# Patient Record
Sex: Male | Born: 1960 | Hispanic: No | Marital: Married | Smoking: Former smoker
Health system: Southern US, Community
[De-identification: ages and names within clinical notes are randomized; demographics above are authoritative.]

## PROBLEM LIST (undated history)

## (undated) DIAGNOSIS — S42009A Fracture of unspecified part of unspecified clavicle, initial encounter for closed fracture: Secondary | ICD-10-CM

## (undated) DIAGNOSIS — M1990 Unspecified osteoarthritis, unspecified site: Secondary | ICD-10-CM

## (undated) DIAGNOSIS — R7303 Prediabetes: Secondary | ICD-10-CM

## (undated) DIAGNOSIS — N189 Chronic kidney disease, unspecified: Secondary | ICD-10-CM

## (undated) DIAGNOSIS — J302 Other seasonal allergic rhinitis: Secondary | ICD-10-CM

## (undated) DIAGNOSIS — Z8489 Family history of other specified conditions: Secondary | ICD-10-CM

## (undated) DIAGNOSIS — E669 Obesity, unspecified: Secondary | ICD-10-CM

## (undated) DIAGNOSIS — Z9889 Other specified postprocedural states: Secondary | ICD-10-CM

## (undated) DIAGNOSIS — J189 Pneumonia, unspecified organism: Secondary | ICD-10-CM

## (undated) DIAGNOSIS — Z87442 Personal history of urinary calculi: Secondary | ICD-10-CM

## (undated) HISTORY — DX: Obesity, unspecified: E66.9

## (undated) HISTORY — PX: NECK SURGERY: SHX720

## (undated) HISTORY — DX: Fracture of unspecified part of unspecified clavicle, initial encounter for closed fracture: S42.009A

## (undated) HISTORY — DX: Personal history of urinary calculi: Z87.442

---
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
## 1987-07-09 HISTORY — PX: KIDNEY SURGERY: SHX687

## 2001-07-08 HISTORY — PX: BICEPS TENDON REPAIR: SHX566

## 2001-07-08 HISTORY — PX: KNEE ARTHROSCOPY: SUR90

## 2005-03-25 ENCOUNTER — Emergency Department: Payer: Self-pay | Admitting: Internal Medicine

## 2011-09-09 ENCOUNTER — Emergency Department: Payer: Self-pay | Admitting: *Deleted

## 2011-09-09 LAB — CBC
HCT: 48.4 % (ref 40.0–52.0)
HGB: 16.2 g/dL (ref 13.0–18.0)
MCH: 31.2 pg (ref 26.0–34.0)
MCHC: 33.4 g/dL (ref 32.0–36.0)
MCV: 93 fL (ref 80–100)
Platelet: 304 10*3/uL (ref 150–440)
RBC: 5.18 10*6/uL (ref 4.40–5.90)
RDW: 13.1 % (ref 11.5–14.5)
WBC: 16.7 10*3/uL — ABNORMAL HIGH (ref 3.8–10.6)

## 2011-09-09 LAB — LIPASE, BLOOD: Lipase: 88 U/L (ref 73–393)

## 2011-09-09 LAB — COMPREHENSIVE METABOLIC PANEL
Albumin: 4.7 g/dL (ref 3.4–5.0)
Alkaline Phosphatase: 95 U/L (ref 50–136)
Anion Gap: 11 (ref 7–16)
BUN: 9 mg/dL (ref 7–18)
Bilirubin,Total: 0.5 mg/dL (ref 0.2–1.0)
Calcium, Total: 9.3 mg/dL (ref 8.5–10.1)
Chloride: 105 mmol/L (ref 98–107)
Co2: 26 mmol/L (ref 21–32)
Creatinine: 0.95 mg/dL (ref 0.60–1.30)
EGFR (African American): >60
EGFR (Non-African Amer.): >60
Glucose: 123 mg/dL — ABNORMAL HIGH (ref 65–99)
Osmolality: 283 (ref 275–301)
Potassium: 3.7 mmol/L (ref 3.5–5.1)
SGOT(AST): 21 U/L (ref 15–37)
SGPT (ALT): 31 U/L
Sodium: 142 mmol/L (ref 136–145)
Total Protein: 8.5 g/dL — ABNORMAL HIGH (ref 6.4–8.2)

## 2011-09-12 ENCOUNTER — Emergency Department: Payer: Self-pay | Admitting: Emergency Medicine

## 2011-09-12 LAB — COMPREHENSIVE METABOLIC PANEL
Alkaline Phosphatase: 72 U/L (ref 50–136)
Anion Gap: 11 (ref 7–16)
Bilirubin,Total: 0.2 mg/dL (ref 0.2–1.0)
Calcium, Total: 8.7 mg/dL (ref 8.5–10.1)
Chloride: 108 mmol/L — ABNORMAL HIGH (ref 98–107)
Co2: 23 mmol/L (ref 21–32)
Creatinine: 0.82 mg/dL (ref 0.60–1.30)
EGFR (Non-African Amer.): >60
Osmolality: 283 (ref 275–301)
Potassium: 3.9 mmol/L (ref 3.5–5.1)
Sodium: 142 mmol/L (ref 136–145)

## 2011-09-12 LAB — CBC WITH DIFFERENTIAL/PLATELET
Basophil #: 0 10*3/uL (ref 0.0–0.1)
Eosinophil %: 1.6 %
Monocyte %: 9.1 %
Neutrophil %: 59.6 %
Platelet: 304 10*3/uL (ref 150–440)
RBC: 4.81 10*6/uL (ref 4.40–5.90)

## 2011-09-16 ENCOUNTER — Other Ambulatory Visit: Payer: Self-pay | Admitting: Sports Medicine

## 2011-09-16 DIAGNOSIS — M542 Cervicalgia: Secondary | ICD-10-CM

## 2011-09-17 ENCOUNTER — Other Ambulatory Visit: Payer: Self-pay | Admitting: Sports Medicine

## 2011-09-17 ENCOUNTER — Ambulatory Visit
Admission: RE | Admit: 2011-09-17 | Discharge: 2011-09-17 | Disposition: A | Discharge status: Home / Self Care | Payer: BC Managed Care – PPO | Source: Ambulatory Visit | Attending: Sports Medicine | Admitting: Sports Medicine

## 2011-09-17 DIAGNOSIS — M542 Cervicalgia: Secondary | ICD-10-CM

## 2011-09-18 ENCOUNTER — Ambulatory Visit
Admission: RE | Admit: 2011-09-18 | Discharge: 2011-09-18 | Disposition: A | Discharge status: Home / Self Care | Payer: BC Managed Care – PPO | Source: Ambulatory Visit | Attending: Sports Medicine | Admitting: Sports Medicine

## 2011-09-18 DIAGNOSIS — M542 Cervicalgia: Secondary | ICD-10-CM

## 2011-09-18 MED ORDER — TRIAMCINOLONE ACETONIDE 40 MG/ML IJ SUSP (RADIOLOGY)
60.0000 mg | Freq: Once | INTRAMUSCULAR | Status: AC
  Administered 2011-09-18: 60 mg via EPIDURAL

## 2011-09-18 MED ORDER — IOHEXOL 300 MG/ML  SOLN
1.0000 mL | Freq: Once | INTRAMUSCULAR | Status: AC | PRN
  Administered 2011-09-18: 1 mL via EPIDURAL

## 2011-09-18 NOTE — Discharge Instructions (Signed)

## 2011-11-06 ENCOUNTER — Other Ambulatory Visit: Payer: Self-pay | Admitting: Neurological Surgery

## 2011-11-07 ENCOUNTER — Encounter (HOSPITAL_COMMUNITY): Payer: Self-pay | Admitting: Pharmacy Technician

## 2011-11-08 ENCOUNTER — Encounter (HOSPITAL_COMMUNITY): Payer: Self-pay

## 2011-11-08 ENCOUNTER — Encounter (HOSPITAL_COMMUNITY)
Admission: RE | Admit: 2011-11-08 | Discharge: 2011-11-08 | Disposition: A | Discharge status: Home / Self Care | Payer: BC Managed Care – PPO | Source: Ambulatory Visit | Attending: Anesthesiology | Admitting: Anesthesiology

## 2011-11-08 ENCOUNTER — Encounter (HOSPITAL_COMMUNITY)
Admission: RE | Admit: 2011-11-08 | Discharge: 2011-11-08 | Disposition: A | Discharge status: Home / Self Care | Payer: BC Managed Care – PPO | Source: Ambulatory Visit | Attending: Neurological Surgery | Admitting: Neurological Surgery

## 2011-11-08 HISTORY — DX: Chronic kidney disease, unspecified: N18.9

## 2011-11-08 HISTORY — DX: Other seasonal allergic rhinitis: J30.2

## 2011-11-08 LAB — BASIC METABOLIC PANEL
BUN: 10 mg/dL (ref 6–23)
CO2: 28 mEq/L (ref 19–32)
Calcium: 9.5 mg/dL (ref 8.4–10.5)
Glucose, Bld: 99 mg/dL (ref 70–99)
Sodium: 139 mEq/L (ref 135–145)

## 2011-11-08 LAB — CBC
MCH: 31.1 pg (ref 26.0–34.0)
MCV: 91.5 fL (ref 78.0–100.0)
Platelets: 266 10*3/uL (ref 150–400)
RBC: 4.82 MIL/uL (ref 4.22–5.81)

## 2011-11-08 NOTE — Consult Note (Addendum)
Anesthesia Consult:  [name] is a 51 year old male scheduled for left C5-6 diskectomy.  History includes obesity (with recent 80 lb weight loss), kidney stones, former smoker.  I evaluated him earlier today during his PAT visit due to elevated BP.  He was in some pain, and had not taken any pain medications because he was driving himself. His highest reading was 156/111 with lowest being 150/89.  (See vitals tab).  He denied history of CP, SOB.  Heart had a RRR, no murmur, no carotid bruits, lungs clear.  EKG from 09/12/11 [facility]) showed NSR.    CXR from 11/08/11 showed no acute cardiopulmonary process.  Labs WNL.    Although pain may be contributing to his hypertension, I and Anesthesiologist Dr. Noreene Larsson felt he should still be evaluated by his PCP to optimize BP control prior to surgery.  Patient is being seen at 1430 today, 11/08/11.  He was instructed to call and update the Short Stay staff if any new medication is initiated.  If no new medication is started, I asked him to be sure to take his pain medication prior to arrival on the day of surgery.    Addendum: 11/11/11 1415    I called and spoke with [name].  He says that his BP was lower by the time he saw his PCP on 11/08/11, but she did go a head and start him on lisinopril 20 mg every AM.  His renal function is WNL, so I did instruct him to take it the morning of surgery.  I left a voicemail for Lucille Passy at Dr. Charlynn Court office updating her.  I would anticipate that since primarily his BP readings on 11/08/11 were 150s/90s on 11/08/11 and now he is on anti-hypertensive medication that his BP would be reasonable on the day of surgery.  His BP will be checked on arrival to Short Stay and called to his Anesthesiologist if indicated.    Shonna Chock, PA-C

## 2011-11-08 NOTE — Pre-Procedure Instructions (Signed)
[name]  11/08/2011   Your procedure is scheduled on:  Tuesday, May 7th.  Report to [facility] at 8:45 AM.  Call this number if you have problems the morning of surgery: [phone]   Remember:   Do not eat food:After Midnight.  May have clear liquids: up to 4 Hours before arrival.  (4:45am)  Clear liquids include soda, tea, black coffee, apple or grape juice, broth.   Take these medicines the morning of surgery with A SIP OF WATER:   Cyclobenzaprine (Flexeril),  Tramadol (Ultram).   Discontinue Glucosamine-Chondroitin.   Do not wear jewelry, make-up or nail polish.  Do not wear lotions, powders, or perfumes. You may wear deodorant.  Do not shave 48 hours prior to surgery.  Do not bring valuables to the hospital.  Contacts, dentures or bridgework may not be worn into surgery.  Leave suitcase in the car. After surgery it may be brought to your room.  For patients admitted to the hospital, checkout time is 11:00 AM the day of discharge.   Patients discharged the day of surgery will not be allowed to drive home.  Name and phone number of your driver: --  Special Instructions: CHG Shower Use Special Wash: 1/2 bottle night before surgery and 1/2 bottle morning of surgery.   Please read over the following fact sheets that you were given: Pain Booklet, Coughing and Deep Breathing, MRSA Information and Surgical Site Infection Prevention

## 2011-11-08 NOTE — Progress Notes (Signed)
Mr. Inks's BP on arrival for PAT visit was 152/90, manually.  After the interview I retook his pressure using a large cuff on his left arm with the dynamap- it was 156/111.  10 minutes later I checked his BP using large cuff, dynamap on his right arm- the BP was 150/89.  I immediatly rechecked his BP  Using large cuff, dynamap on his left arm-154/97.  Pt felt that this could be related to all the coffee he has been drinking and the fact that he had not taken any pain medication today. Pt said that his BP is never high, except the time he arrived at [facility] with neck pain and it was high then.  I notified Edmonia Caprio who spoke with Dr Noreene Larsson.  Upon Dr Morley Kos recommendation  ,Mr. Skowronek was asked to see his PCP today.  With Mr. Rhett permission I called [facility] and pt was given an appointment at 1430 with Dr. Sallee Lange.  Mr. Codispoti accepted this appointment and signed consent for [place] to fax the information we obtained to Dr Sallee Lange' office.

## 2011-11-12 ENCOUNTER — Ambulatory Visit (HOSPITAL_COMMUNITY)
Admission: RE | Admit: 2011-11-12 | Discharge: 2011-11-13 | Disposition: A | Discharge status: Home / Self Care | Payer: BC Managed Care – PPO | Source: Ambulatory Visit | Attending: Neurological Surgery | Admitting: Neurological Surgery

## 2011-11-12 ENCOUNTER — Ambulatory Visit (HOSPITAL_COMMUNITY): Payer: BC Managed Care – PPO

## 2011-11-12 ENCOUNTER — Encounter (HOSPITAL_COMMUNITY)
Admission: RE | Disposition: A | Discharge status: Home / Self Care | Payer: Self-pay | Source: Ambulatory Visit | Attending: Neurological Surgery

## 2011-11-12 ENCOUNTER — Encounter (HOSPITAL_COMMUNITY): Payer: Self-pay | Admitting: Vascular Surgery

## 2011-11-12 ENCOUNTER — Ambulatory Visit (HOSPITAL_COMMUNITY): Payer: BC Managed Care – PPO | Admitting: Vascular Surgery

## 2011-11-12 DIAGNOSIS — Z01812 Encounter for preprocedural laboratory examination: Secondary | ICD-10-CM | POA: Insufficient documentation

## 2011-11-12 DIAGNOSIS — M50222 Other cervical disc displacement at C5-C6 level: Secondary | ICD-10-CM

## 2011-11-12 DIAGNOSIS — B159 Hepatitis A without hepatic coma: Secondary | ICD-10-CM | POA: Insufficient documentation

## 2011-11-12 DIAGNOSIS — Z01818 Encounter for other preprocedural examination: Secondary | ICD-10-CM | POA: Insufficient documentation

## 2011-11-12 DIAGNOSIS — M5020 Other cervical disc displacement, unspecified cervical region: Principal | ICD-10-CM | POA: Insufficient documentation

## 2011-11-12 DIAGNOSIS — I10 Essential (primary) hypertension: Secondary | ICD-10-CM | POA: Insufficient documentation

## 2011-11-12 SURGERY — POSTERIOR CERVICAL LAMINECTOMY WITH MET- RX
Anesthesia: General | Site: Neck | Laterality: Left | Wound class: Clean

## 2011-11-12 MED ORDER — DEXAMETHASONE SODIUM PHOSPHATE 4 MG/ML IJ SOLN
INTRAMUSCULAR | Status: DC | PRN
  Administered 2011-11-12: 10 mg via INTRAVENOUS

## 2011-11-12 MED ORDER — PROPOFOL 10 MG/ML IV EMUL
INTRAVENOUS | Status: DC | PRN
  Administered 2011-11-12: 300 mg via INTRAVENOUS
  Administered 2011-11-12: 100 mg via INTRAVENOUS

## 2011-11-12 MED ORDER — HETASTARCH-ELECTROLYTES 6 % IV SOLN
INTRAVENOUS | Status: DC | PRN
  Administered 2011-11-12: 13:00:00 via INTRAVENOUS

## 2011-11-12 MED ORDER — PHENOL 1.4 % MT LIQD: 1.0000 | OROMUCOSAL | Status: DC | PRN

## 2011-11-12 MED ORDER — TRAMADOL HCL 50 MG PO TABS
50.0000 mg | ORAL_TABLET | Freq: Every evening | ORAL | Status: DC | PRN
  Filled 2011-11-12: qty 1

## 2011-11-12 MED ORDER — LIDOCAINE-EPINEPHRINE 1 %-1:100000 IJ SOLN
INTRAMUSCULAR | Status: DC | PRN
  Administered 2011-11-12: 7 mL

## 2011-11-12 MED ORDER — HYDROMORPHONE HCL PF 1 MG/ML IJ SOLN
INTRAMUSCULAR | Status: AC
  Filled 2011-11-12: qty 1

## 2011-11-12 MED ORDER — LISINOPRIL 20 MG PO TABS
20.0000 mg | ORAL_TABLET | Freq: Every day | ORAL | Status: DC
  Administered 2011-11-13: 20 mg via ORAL
  Filled 2011-11-12 (×2): qty 1

## 2011-11-12 MED ORDER — CYCLOBENZAPRINE HCL 10 MG PO TABS
10.0000 mg | ORAL_TABLET | Freq: Every evening | ORAL | Status: DC | PRN
  Administered 2011-11-13: 10 mg via ORAL
  Filled 2011-11-12: qty 1

## 2011-11-12 MED ORDER — NEOSTIGMINE METHYLSULFATE 1 MG/ML IJ SOLN
INTRAMUSCULAR | Status: DC | PRN
  Administered 2011-11-12: 5 mg via INTRAVENOUS

## 2011-11-12 MED ORDER — SODIUM CHLORIDE 0.9 % IV SOLN
10.0000 mg | INTRAVENOUS | Status: DC | PRN
  Administered 2011-11-12: 50 ug/min via INTRAVENOUS

## 2011-11-12 MED ORDER — ONDANSETRON HCL 4 MG/2ML IJ SOLN
4.0000 mg | INTRAMUSCULAR | Status: DC | PRN
  Administered 2011-11-12: 4 mg via INTRAVENOUS
  Filled 2011-11-12: qty 2

## 2011-11-12 MED ORDER — OXYCODONE-ACETAMINOPHEN 5-325 MG PO TABS: 1.0000 | ORAL_TABLET | ORAL | Status: DC | PRN

## 2011-11-12 MED ORDER — SODIUM CHLORIDE 0.9 % IV SOLN
INTRAVENOUS | Status: AC
  Filled 2011-11-12: qty 500

## 2011-11-12 MED ORDER — MENTHOL 3 MG MT LOZG: 1.0000 | LOZENGE | OROMUCOSAL | Status: DC | PRN

## 2011-11-12 MED ORDER — MIDAZOLAM HCL 5 MG/5ML IJ SOLN
INTRAMUSCULAR | Status: DC | PRN
  Administered 2011-11-12: 2 mg via INTRAVENOUS

## 2011-11-12 MED ORDER — SODIUM CHLORIDE 0.9 % IJ SOLN: 3.0000 mL | Freq: Two times a day (BID) | INTRAMUSCULAR | Status: DC

## 2011-11-12 MED ORDER — HYDROMORPHONE HCL PF 1 MG/ML IJ SOLN
0.2500 mg | INTRAMUSCULAR | Status: DC | PRN
  Administered 2011-11-12 (×2): 0.5 mg via INTRAVENOUS

## 2011-11-12 MED ORDER — FENTANYL CITRATE 0.05 MG/ML IJ SOLN
INTRAMUSCULAR | Status: DC | PRN
  Administered 2011-11-12 (×2): 100 ug via INTRAVENOUS

## 2011-11-12 MED ORDER — SODIUM CHLORIDE 0.9 % IR SOLN
Status: DC | PRN
  Administered 2011-11-12: 13:00:00

## 2011-11-12 MED ORDER — MORPHINE SULFATE 2 MG/ML IJ SOLN
1.0000 mg | INTRAMUSCULAR | Status: DC | PRN
  Administered 2011-11-12: 2 mg via INTRAVENOUS
  Administered 2011-11-13: 4 mg via INTRAVENOUS
  Filled 2011-11-12: qty 1
  Filled 2011-11-12: qty 2

## 2011-11-12 MED ORDER — ACETAMINOPHEN 650 MG RE SUPP: 650.0000 mg | RECTAL | Status: DC | PRN

## 2011-11-12 MED ORDER — MORPHINE SULFATE 4 MG/ML IJ SOLN: 0.0500 mg/kg | INTRAMUSCULAR | Status: DC | PRN

## 2011-11-12 MED ORDER — LACTATED RINGERS IV SOLN
INTRAVENOUS | Status: DC | PRN
  Administered 2011-11-12: 13:00:00 via INTRAVENOUS

## 2011-11-12 MED ORDER — CEFAZOLIN SODIUM-DEXTROSE 2-3 GM-% IV SOLR
INTRAVENOUS | Status: AC
  Filled 2011-11-12: qty 50

## 2011-11-12 MED ORDER — SODIUM CHLORIDE 0.9 % IV SOLN: 250.0000 mL | INTRAVENOUS | Status: DC

## 2011-11-12 MED ORDER — ALUM & MAG HYDROXIDE-SIMETH 200-200-20 MG/5ML PO SUSP: 30.0000 mL | Freq: Four times a day (QID) | ORAL | Status: DC | PRN

## 2011-11-12 MED ORDER — 0.9 % SODIUM CHLORIDE (POUR BTL) OPTIME
TOPICAL | Status: DC | PRN
  Administered 2011-11-12: 1000 mL

## 2011-11-12 MED ORDER — THROMBIN 5000 UNITS EX SOLR: CUTANEOUS | Status: DC | PRN

## 2011-11-12 MED ORDER — ACETAMINOPHEN 325 MG PO TABS: 650.0000 mg | ORAL_TABLET | ORAL | Status: DC | PRN

## 2011-11-12 MED ORDER — GLYCOPYRROLATE 0.2 MG/ML IJ SOLN
INTRAMUSCULAR | Status: DC | PRN
  Administered 2011-11-12: .9 mg via INTRAVENOUS

## 2011-11-12 MED ORDER — BACITRACIN 50000 UNITS IM SOLR
INTRAMUSCULAR | Status: AC
  Filled 2011-11-12: qty 1

## 2011-11-12 MED ORDER — PHENYLEPHRINE HCL 10 MG/ML IJ SOLN
INTRAMUSCULAR | Status: DC | PRN
  Administered 2011-11-12: 40 ug via INTRAVENOUS
  Administered 2011-11-12 (×2): 80 ug via INTRAVENOUS

## 2011-11-12 MED ORDER — ONDANSETRON HCL 4 MG/2ML IJ SOLN
INTRAMUSCULAR | Status: DC | PRN
  Administered 2011-11-12: 4 mg via INTRAVENOUS

## 2011-11-12 MED ORDER — SODIUM CHLORIDE 0.9 % IJ SOLN: 3.0000 mL | INTRAMUSCULAR | Status: DC | PRN

## 2011-11-12 MED ORDER — BUPIVACAINE HCL (PF) 0.5 % IJ SOLN
INTRAMUSCULAR | Status: DC | PRN
  Administered 2011-11-12: 7 mL

## 2011-11-12 MED ORDER — HEMOSTATIC AGENTS (NO CHARGE) OPTIME
TOPICAL | Status: DC | PRN
  Administered 2011-11-12: 1 via TOPICAL

## 2011-11-12 MED ORDER — LIDOCAINE HCL (CARDIAC) 20 MG/ML IV SOLN
INTRAVENOUS | Status: DC | PRN
  Administered 2011-11-12: 50 mg via INTRAVENOUS

## 2011-11-12 MED ORDER — SUCCINYLCHOLINE CHLORIDE 20 MG/ML IJ SOLN
INTRAMUSCULAR | Status: DC | PRN
  Administered 2011-11-12: 130 mg via INTRAVENOUS

## 2011-11-12 MED ORDER — THROMBIN 5000 UNITS EX KIT
PACK | CUTANEOUS | Status: DC | PRN
  Administered 2011-11-12 (×2): 5000 [IU] via TOPICAL

## 2011-11-12 MED ORDER — CEFAZOLIN SODIUM-DEXTROSE 2-3 GM-% IV SOLR
2.0000 g | INTRAVENOUS | Status: AC
  Administered 2011-11-12: 2 g via INTRAVENOUS

## 2011-11-12 MED ORDER — LACTATED RINGERS IV SOLN
INTRAVENOUS | Status: DC | PRN
  Administered 2011-11-12 (×2): via INTRAVENOUS

## 2011-11-12 MED ORDER — VECURONIUM BROMIDE 10 MG IV SOLR
INTRAVENOUS | Status: DC | PRN
  Administered 2011-11-12: 10 mg via INTRAVENOUS

## 2011-11-12 SURGICAL SUPPLY — 51 items
BAG DECANTER FOR FLEXI CONT (MISCELLANEOUS) ×2 IMPLANT
BLADE SURG 11 STRL SS (BLADE) ×2 IMPLANT
BLADE SURG 15 STRL LF DISP TIS (BLADE) IMPLANT
BLADE SURG 15 STRL SS (BLADE)
BLADE SURG ROTATE 9660 (MISCELLANEOUS) IMPLANT
CANISTER SUCTION 2500CC (MISCELLANEOUS) ×2 IMPLANT
CLOTH BEACON ORANGE TIMEOUT ST (SAFETY) ×2 IMPLANT
CONT SPEC 4OZ CLIKSEAL STRL BL (MISCELLANEOUS) ×2 IMPLANT
DECANTER SPIKE VIAL GLASS SM (MISCELLANEOUS) ×2 IMPLANT
DERMABOND ADVANCED (GAUZE/BANDAGES/DRESSINGS) ×1
DERMABOND ADVANCED .7 DNX12 (GAUZE/BANDAGES/DRESSINGS) ×1 IMPLANT
DRAPE C-ARM 42X72 X-RAY (DRAPES) ×2 IMPLANT
DRAPE LAPAROTOMY 100X72 PEDS (DRAPES) ×2 IMPLANT
DRAPE MICROSCOPE LEICA (MISCELLANEOUS) ×2 IMPLANT
DRAPE POUCH INSTRU U-SHP 10X18 (DRAPES) ×2 IMPLANT
DURAPREP 6ML APPLICATOR 50/CS (WOUND CARE) ×2 IMPLANT
ELECT BLADE 6.5 EXT (BLADE) ×2 IMPLANT
ELECT REM PT RETURN 9FT ADLT (ELECTROSURGICAL) ×2
ELECTRODE REM PT RTRN 9FT ADLT (ELECTROSURGICAL) ×1 IMPLANT
GAUZE SPONGE 4X4 16PLY XRAY LF (GAUZE/BANDAGES/DRESSINGS) IMPLANT
GLOVE BIOGEL M 8.0 STRL (GLOVE) ×2 IMPLANT
GLOVE BIOGEL PI IND STRL 8.5 (GLOVE) ×1 IMPLANT
GLOVE BIOGEL PI INDICATOR 8.5 (GLOVE) ×1
GLOVE ECLIPSE 8.5 STRL (GLOVE) ×4 IMPLANT
GLOVE EXAM NITRILE LRG STRL (GLOVE) IMPLANT
GLOVE EXAM NITRILE MD LF STRL (GLOVE) IMPLANT
GLOVE EXAM NITRILE XL STR (GLOVE) IMPLANT
GLOVE EXAM NITRILE XS STR PU (GLOVE) IMPLANT
GLOVE INDICATOR 7.0 STRL GRN (GLOVE) ×4 IMPLANT
GLOVE SURG SS PI 6.5 STRL IVOR (GLOVE) ×6 IMPLANT
GOWN BRE IMP SLV AUR LG STRL (GOWN DISPOSABLE) ×2 IMPLANT
GOWN BRE IMP SLV AUR XL STRL (GOWN DISPOSABLE) ×4 IMPLANT
GOWN STRL REIN 2XL LVL4 (GOWN DISPOSABLE) ×2 IMPLANT
KIT BASIN OR (CUSTOM PROCEDURE TRAY) ×2 IMPLANT
KIT ROOM TURNOVER OR (KITS) ×2 IMPLANT
NEEDLE HYPO 18GX1.5 BLUNT FILL (NEEDLE) IMPLANT
NEEDLE HYPO 22GX1.5 SAFETY (NEEDLE) ×2 IMPLANT
NEEDLE SPNL 20GX3.5 QUINCKE YW (NEEDLE) IMPLANT
NS IRRIG 1000ML POUR BTL (IV SOLUTION) ×2 IMPLANT
PACK LAMINECTOMY NEURO (CUSTOM PROCEDURE TRAY) ×2 IMPLANT
PAD ARMBOARD 7.5X6 YLW CONV (MISCELLANEOUS) ×6 IMPLANT
PIN MAYFIELD SKULL DISP (PIN) ×2 IMPLANT
RUBBERBAND STERILE (MISCELLANEOUS) ×4 IMPLANT
SPONGE SURGIFOAM ABS GEL SZ50 (HEMOSTASIS) ×2 IMPLANT
SUT VIC AB 3-0 SH 8-18 (SUTURE) ×2 IMPLANT
SYR 20ML ECCENTRIC (SYRINGE) ×2 IMPLANT
SYR 5ML LL (SYRINGE) IMPLANT
TOWEL OR 17X24 6PK STRL BLUE (TOWEL DISPOSABLE) ×2 IMPLANT
TOWEL OR 17X26 10 PK STRL BLUE (TOWEL DISPOSABLE) ×2 IMPLANT
WATER STERILE IRR 1000ML POUR (IV SOLUTION) ×2 IMPLANT
WIRE TIP MIS 2.5MM NEURO (BURR) ×2 IMPLANT

## 2011-11-12 NOTE — Preoperative (Signed)
Beta Blockers   Reason not to administer Beta Blockers:Not Applicable 

## 2011-11-12 NOTE — Transfer of Care (Signed)
Immediate Anesthesia Transfer of Care Note  Patient: Peter Nguyen  Procedure(s) Performed: Procedure(s) (LRB): POSTERIOR CERVICAL LAMINECTOMY WITH MET- RX (Left)  Patient Location: PACU  Anesthesia Type: General  Level of Consciousness: awake, alert  and oriented  Airway & Oxygen Therapy: Patient Spontanous Breathing and Patient connected to nasal cannula oxygen  Post-op Assessment: Report given to PACU RN, Post -op Vital signs reviewed and stable and Patient moving all extremities X 4  Post vital signs: Reviewed and stable  Complications: No apparent anesthesia complications

## 2011-11-12 NOTE — Anesthesia Procedure Notes (Addendum)
Procedure Name: Intubation Date/Time: 11/12/2011 12:28 PM Performed by: Carmela Rima Pre-anesthesia Checklist: Patient identified, Timeout performed, Emergency Drugs available, Suction available and Patient being monitored Patient Re-evaluated:Patient Re-evaluated prior to inductionOxygen Delivery Method: Circle system utilized Preoxygenation: Pre-oxygenation with 100% oxygen Intubation Type: IV induction Ventilation: Mask ventilation without difficulty Laryngoscope Size: Mac and 4 Grade View: Grade II Tube type: Oral Tube size: 7.5 mm Number of attempts: 1 Placement Confirmation: ETT inserted through vocal cords under direct vision,  breath sounds checked- equal and bilateral and positive ETCO2 Secured at: 23 cm Tube secured with: Tape Dental Injury: Teeth and Oropharynx as per pre-operative assessment     A functioning IV was confirmed and monitors were applied.   Right CVP dual lumen Preop holding:  1125-1145: The patient was identified and consent obtained.  TO was performed, and full barrier precautions were used.  The skin was anesthetized with lidocaine.  Once the vein was located with the 22 ga. needle using ultrasound guidance , the wire was inserted into the vein.  The wire location was confirmed with ultrasound.  The tissue was dilated and the catheter was carefully inserted, then sutured in place. A dressing was applied. The patient tolerated the procedure well.  CE

## 2011-11-12 NOTE — Op Note (Signed)
Date of operation: 11/12/2011 Preoperative diagnosis: Herniated nucleus pulposus C5-C6 left with left cervical radiculopathy Post operative diagnosis: Herniated nucleus pulposus C5-C6 left left cervical radiculopathy Procedure: Cervical laminotomy and foraminotomies in the sitting position with operating microscope microdissection technique, met-rx Surgeon: Barnett Abu M.D. Assistant: Hilda Lias M.D. Anesthesia: Gen. endotracheal Indications: Patient is a 51 year old individual's had significant neck shoulder and left arm pain with weakness in the biceps and grip on the left side is tried efforts at conservative management for the past 6 weeks time and things seem to be worsening in terms of the pain and the degree weakness days been experiencing he has a foraminal disc protrusion at C5-C6 on the left side by MRI and he's been advised regarding surgical intervention.   Procedure: Patient was brought to the operating room supine on a stretcher having had central venous monitoring catheter in appropriate other lines placed in the preoperative holding area. After the smooth induction of general endotracheal anesthesia the patient was placed in a 3 pin headrest and then carefully placed into the seated position the back of the neck exposed. Fluoroscopy imaging was brought into the cross table lateral position to view the vertebrae of the cervical spine. Back of the neck was cleansed with alcohol and DuraPrep and draped in a sterile fashion. Localizing radiographs with a small 22-gauge needle identifying the surface anatomy was used to localize the appropriate level. C5-C6. Was identified positively on the radiograph and correlated with fluoroscopy. Skin in this area was infiltrated with 10 cc of 1% lidocaine mixed 50-50 with half percent Marcaine and 1-100,000 epinephrine. A small vertical incision was created over this area and a K wire was then passed to the interlaminar space of C5-6. A wanding  technique a series of dilators was used to create an opening down to the interlaminar space. Ultimately a 7 cm centimeter by 18 mm diameter cannula was placed in the wound and fixed to the operating table with a clamp. The operating microscope was brought into the field and through the aperture soft tissues above the interlaminar space of C5-6 were clean. The inferior margin of the lamina of C 5 and the superior margin of the lamina and facet complex of C6 were then removed with 2.2 mm high-speed bur. Soft tissues in this area were then cauterized and divided and this identified the path of the C6 nerve root. This was carefully dissected and by working from underneath the nerve root a significant mass of tissue was identified. When this was incised with a #11 blade under moderate pressure some fragments of this extruded themselves. These were removed with a micropituitary rongeur. Further dissection yielded other fragments of disc which were similarly removed. Dissection over the shoulder of the nerve was then performed and this yielded further fragments of disc. This procedure continued from above and below the exiting nerve root until all fragments were removed. The stasis was then carefully obtained with some small pledgets of Gelfoam soaked in thrombin which were then tear gated away. When adequate decompression was completed the area was inspected for hemostasis yet again and then the endoscopic cannula was removed and the fascia and the lung was closed with 3-0 Vicryl in interrupted fashion and the skin was closed with 3-0 Vicryl in an inverted interrupted fashion. Dermabond was placed on the skin blood loss was 20 cc.

## 2011-11-12 NOTE — Anesthesia Preprocedure Evaluation (Addendum)
Anesthesia Evaluation  Patient identified by MRN, date of birth, ID band Patient awake    Reviewed: Allergy & Precautions, H&P , NPO status , Patient's Chart, lab work & pertinent test results  Airway Mallampati: II TM Distance: >3 FB Neck ROM: limited    Dental  (+) Dental Advidsory Given   Pulmonary neg pulmonary ROS,  breath sounds clear to auscultation        Cardiovascular hypertension, On Medications Rhythm:Regular Rate:Normal     Neuro/Psych negative neurological ROS     GI/Hepatic negative GI ROS, (+) Hepatitis -, A  Endo/Other  negative endocrine ROSMorbid obesity  Renal/GU      Musculoskeletal   Abdominal   Peds  Hematology negative hematology ROS (+)   Anesthesia Other Findings   Reproductive/Obstetrics                           Anesthesia Physical Anesthesia Plan  ASA: II  Anesthesia Plan: General ETT and General   Post-op Pain Management:    Induction: Intravenous  Airway Management Planned: Oral ETT  Additional Equipment: CVP and Arterial line  Intra-op Plan:   Post-operative Plan: Possible Post-op intubation/ventilation  Informed Consent:   Dental Advisory Given  Plan Discussed with: Anesthesiologist, CRNA and Surgeon  Anesthesia Plan Comments:        Anesthesia Quick Evaluation

## 2011-11-12 NOTE — H&P (Signed)
Patient reports persistent symptoms no change in exam. 11/12/2011 [id number] DOB:  09/18/1960 11/06/2011  Jarius returns to the office today. He tells me that he is still having considerable symptoms in that left upper extremity with radiculopathy and dysesthesias into the thumb and index finger. He notes that he has gripped a pen differently. He finds that he still gets neck stiffness and soreness, and the shoulder is still bothering him.  He does not feel it is any better and in fact he feels that it may have gotten worse in the last week or so.    I reviewed his MRI again and note that he has some modest disc degenerative changes at C4-5 with a bulge eccentric to the right side.  He also has some central bulging of the disc at C6-7, but at C5-6 he has the left-sided foraminal disc protrusion.    Again we considered surgical intervention. I believe that Moshe's best option for this would be with a simple posterior decompression of the C6 nerve root.  He understands the risks and the concerns of a potential for recurrence of the disc herniation, but in terms of his current problem, I believe that decompressing the C6 nerve root should give him the best option for relief in the long term.  I am worried that if we did an anterior procedure at C5-6 he would soon degenerate or wear out C4-5 or C6-7, thus necessitating further surgical intervention.    I noted on his exam today that he does have some weakness in the wrist extensor, although his grip strength seems quite good.  He feels that there is persistence of weakness in addition to the uncomfortable symptoms.   We will plan on scheduling a posterior decompression at C5-6 on the left at the earliest convenience.           Stefani Dama, M.D./[facility]  [facility]  #[id number]  DOB:  06-03-1961       CHIEF COMPLAINT:   Neck to shoulder pain on the left side with pain into the left arm.    HISTORY OF PRESENT ILLNESS:  Peter Nguyen is a  51 year old, left-handed individual who tells me that on March 4th of 2013 he had foot poisoning which caused him to retch and vomit very vigorously.  He rather suddenly developed neck and left arm pain so severely that he was seen in the emergency room.  He became dehydrated from his food poisoning, but the pain in the left shoulder and arm persisted and workup of this revealed that ultimately that he has a herniated nucleus pulposus at C5-C6 on the left side.  He brings with him an MRI performed on March 12 of 2013.  It demonstrates this finding.  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  He tells me that initially he had numbness in the region of the thumb nearly entirely, but this has subsided to where only the tip of the thumb itself seems to have some numbness and in fact he describes some hypesthesia in the left thumb.  He feels that his strength is a little off in that left arm and he feels as though the muscle is sore as if he had too vigorous a workout, but he does not have any significant pain per se.  The patient has been given a shot of cortisone  in the region of the neck and he notes this seemed to help lessen the symptoms considerably.  He was also started on some medication in the form of Flexeril and Tramadol.  He is referred her via the courtesy of Dr. Frazier Butt who did the workup of his cervical spine.    PAST MEDICAL HISTORY:  His general health is good.  He reports no significant medical problems whatsoever.    PAST SURGICAL HISTORY:  Surgeries in the past include a scoping of the knee on the right side in 1993, another right knee scope in 2003 and left bicep repair in 2012.    DRUG ALLERGIES:   HE NOTES ALLERGIES TO CIPRO AND NARCOTICS WHICH AGGRAVATE NAUSEA.    PERSONAL HISTORY:   He does not smoke or use alcohol and he has not had any significant weight gain.   He notes his height and weight are 6' and 280 lbs.   REVIEW OF SYSTEMS:   Systems review is notable for wearing of glasses on a 14-point review sheet.    MEDICATIONS:    His current medications include Flexeril and Tramadol, in addition to Glucosamine and Chondroitin.  He has been using some Aleve as needed for pain and some ice therapy.    He does include a sheet which illustrates his current symptoms with the pain and discomfort and the soreness that he has in the musculature on that left upper extremity in the numb area in his thumb.    PHYSICAL EXAMINATION:  I note that his range of motion allows him to turn 45 degrees to the left and to the right rather comfortably.  He extends and flexes about 50% of normal.  His motor strength in the upper extremities reveals that the deltoids have good strength good.  Biceps has slight degradation of strength to 4/5 on the left side compared to the right side.  Wrist extensor and grip is also degraded to 4/5 on the left compared to the right.  No atrophy is noted in the major muscle groups or the hand muscles.  He has good intrinsic function.    IMPRESSION:    The patient has a herniated nucleus pulposus at C5-C6 on the left with a left cervical radiculopathy.  It seems to me that the most acute and worst of the symptoms are subsiding and I have advised the patient that I would suggest waiting to see if the symptoms don't ameliorate themselves over the next 3 weeks or so.  He may have some slight residual component of pain and discomfort, but if he can tolerate this, I believe that this process will go on to heal itself.  If the symptoms should worsen or become more persistent, then I discussed consideration of one of two possible surgical interventions.  One would be a posterior laminotomy and foraminotomy to decompress the disc herniation and relieve pressure on the left side at the C6 nerve root.  The other option would be to consider an anterior decompression  and arthrodesis to completely alleviate the entirety of the disc.  Which surgery should be considered depends more on the patient's symptoms.  If he was having considerable problems with centralized localized neck pain, I would tend to opt for the anterior cervical decompression and arthrodesis, but if he is having purely radicular symptoms, then I believe a  posterior decompression and fusion would suffice.  We discussed the pros and cons of each surgical intervention.  One limits the use of the joint  by fusing it completely and may place some additional stresses on the adjacent joints that is the anterior cervical decompression.  The other preserves the integrity of the joint space.  However, it does open him up to the possibility that he could have a recurrent disc herniation.  This in infrequent, but does happen on rare occasions.  I noted that the typical recurrence rate is between 3 and 5%.  For the time being, however, I believe that we should treat the patient conservatively as I believe he may get over this process over the next number of weeks.  We will follow along conservatively.

## 2011-11-12 NOTE — Anesthesia Postprocedure Evaluation (Signed)
  Anesthesia Post-op Note  Patient: Peter Nguyen  Procedure(s) Performed: Procedure(s) (LRB): POSTERIOR CERVICAL LAMINECTOMY WITH MET- RX (Left)  Patient Location: PACU  Anesthesia Type: General  Level of Consciousness: awake  Airway and Oxygen Therapy: Patient Spontanous Breathing  Post-op Pain: mild  Post-op Assessment: Post-op Vital signs reviewed  Post-op Vital Signs: Reviewed  Complications: No apparent anesthesia complications

## 2011-11-12 NOTE — Progress Notes (Signed)
Pt. With cental line in place from OR, after cxray cline dced intact per protocol

## 2011-11-13 MED ORDER — DIAZEPAM 5 MG PO TABS: 5.0000 mg | ORAL_TABLET | Freq: Four times a day (QID) | ORAL | Status: AC | PRN

## 2011-11-13 MED ORDER — OXYCODONE-ACETAMINOPHEN 5-325 MG PO TABS: 1.0000 | ORAL_TABLET | ORAL | Status: AC | PRN

## 2011-11-13 NOTE — Discharge Summary (Signed)
Date of admission 11/12/2011 Date of discharge 11/13/2011 Admitting diagnosis herniated nucleus pulposus C5-C6 left with left cervical radiculopathy Discharge diagnosis: Herniated nucleus pulposus C5-C6 left with left cervical radiculopathy Operation: Posterior cervical discectomy decompression of C6 nerve root in sitting position with operating microscope microdissection technique using a Metrix operating system Condition on discharge: Improved Discharge medications: Percocet #30 without refills 5/325 Valium 5 mg #30 without refills Hospital course: Patient was admitted to undergo surgical decompression of C5-C6 via a posterior discectomy. He tolerated the surgery well however he had postoperative nausea and vomiting. He was therefore maintained in the hospital overnight. The following morning his nausea is completely subsided he is improving neurologically in the left upper extremity is pleased with his progress. He is discharged home.

## 2015-05-18 ENCOUNTER — Emergency Department
Admission: EM | Admit: 2015-05-18 | Discharge: 2015-05-18 | Disposition: A | Discharge status: Home / Self Care | Payer: BC Managed Care – PPO | Attending: Emergency Medicine | Admitting: Emergency Medicine

## 2015-05-18 ENCOUNTER — Encounter: Payer: Self-pay | Admitting: Emergency Medicine

## 2015-05-18 DIAGNOSIS — Z79899 Other long term (current) drug therapy: Secondary | ICD-10-CM | POA: Insufficient documentation

## 2015-05-18 DIAGNOSIS — G43009 Migraine without aura, not intractable, without status migrainosus: Secondary | ICD-10-CM

## 2015-05-18 DIAGNOSIS — R112 Nausea with vomiting, unspecified: Secondary | ICD-10-CM

## 2015-05-18 DIAGNOSIS — G43909 Migraine, unspecified, not intractable, without status migrainosus: Secondary | ICD-10-CM | POA: Insufficient documentation

## 2015-05-18 DIAGNOSIS — Z87891 Personal history of nicotine dependence: Secondary | ICD-10-CM | POA: Diagnosis not present

## 2015-05-18 DIAGNOSIS — R51 Headache: Secondary | ICD-10-CM | POA: Diagnosis present

## 2015-05-18 DIAGNOSIS — N390 Urinary tract infection, site not specified: Secondary | ICD-10-CM | POA: Insufficient documentation

## 2015-05-18 LAB — COMPREHENSIVE METABOLIC PANEL
ALK PHOS: 88 U/L (ref 38–126)
ALT: 22 U/L (ref 17–63)
ANION GAP: 5 (ref 5–15)
AST: 22 U/L (ref 15–41)
Albumin: 4.7 g/dL (ref 3.5–5.0)
BILIRUBIN TOTAL: 0.6 mg/dL (ref 0.3–1.2)
BUN: 11 mg/dL (ref 6–20)
CALCIUM: 9.2 mg/dL (ref 8.9–10.3)
CO2: 27 mmol/L (ref 22–32)
Chloride: 106 mmol/L (ref 101–111)
Creatinine, Ser: 0.63 mg/dL (ref 0.61–1.24)
Glucose, Bld: 112 mg/dL — ABNORMAL HIGH (ref 65–99)
POTASSIUM: 4.3 mmol/L (ref 3.5–5.1)
Sodium: 138 mmol/L (ref 135–145)
TOTAL PROTEIN: 7.5 g/dL (ref 6.5–8.1)

## 2015-05-18 LAB — URINALYSIS COMPLETE WITH MICROSCOPIC (ARMC ONLY)
BILIRUBIN URINE: NEGATIVE
Glucose, UA: NEGATIVE mg/dL
Hgb urine dipstick: NEGATIVE
Ketones, ur: NEGATIVE mg/dL
Leukocytes, UA: NEGATIVE
Nitrite: NEGATIVE
PH: 7 (ref 5.0–8.0)
Protein, ur: NEGATIVE mg/dL
Specific Gravity, Urine: 1.02 (ref 1.005–1.030)

## 2015-05-18 LAB — CBC
HCT: 47.4 % (ref 40.0–52.0)
HEMOGLOBIN: 15.7 g/dL (ref 13.0–18.0)
MCH: 30.6 pg (ref 26.0–34.0)
MCHC: 33.2 g/dL (ref 32.0–36.0)
MCV: 92.1 fL (ref 80.0–100.0)
PLATELETS: 284 10*3/uL (ref 150–440)
RBC: 5.14 MIL/uL (ref 4.40–5.90)
RDW: 13.1 % (ref 11.5–14.5)
WBC: 11.4 10*3/uL — AB (ref 3.8–10.6)

## 2015-05-18 LAB — LIPASE, BLOOD: Lipase: 22 U/L (ref 11–51)

## 2015-05-18 LAB — TROPONIN I

## 2015-05-18 MED ORDER — METOCLOPRAMIDE HCL 5 MG/ML IJ SOLN
10.0000 mg | Freq: Once | INTRAMUSCULAR | Status: AC
  Administered 2015-05-18: 10 mg via INTRAVENOUS

## 2015-05-18 MED ORDER — ONDANSETRON HCL 4 MG PO TABS: ORAL_TABLET | ORAL | Status: DC

## 2015-05-18 MED ORDER — CEPHALEXIN 500 MG PO CAPS: 500.0000 mg | ORAL_CAPSULE | Freq: Two times a day (BID) | ORAL | Status: DC

## 2015-05-18 MED ORDER — SODIUM CHLORIDE 0.9 % IV BOLUS (SEPSIS)
1000.0000 mL | INTRAVENOUS | Status: AC
  Administered 2015-05-18: 1000 mL via INTRAVENOUS

## 2015-05-18 MED ORDER — DIPHENHYDRAMINE HCL 50 MG/ML IJ SOLN
INTRAMUSCULAR | Status: AC
  Administered 2015-05-18: 25 mg via INTRAVENOUS
  Filled 2015-05-18: qty 1

## 2015-05-18 MED ORDER — KETOROLAC TROMETHAMINE 30 MG/ML IJ SOLN
INTRAMUSCULAR | Status: AC
  Administered 2015-05-18: 30 mg via INTRAVENOUS
  Filled 2015-05-18: qty 1

## 2015-05-18 MED ORDER — METOCLOPRAMIDE HCL 5 MG/ML IJ SOLN
INTRAMUSCULAR | Status: AC
  Administered 2015-05-18: 10 mg via INTRAVENOUS
  Filled 2015-05-18: qty 2

## 2015-05-18 MED ORDER — KETOROLAC TROMETHAMINE 30 MG/ML IJ SOLN
30.0000 mg | Freq: Once | INTRAMUSCULAR | Status: AC
  Administered 2015-05-18: 30 mg via INTRAVENOUS

## 2015-05-18 MED ORDER — DIPHENHYDRAMINE HCL 50 MG/ML IJ SOLN
25.0000 mg | Freq: Once | INTRAMUSCULAR | Status: AC
  Administered 2015-05-18: 25 mg via INTRAVENOUS

## 2015-05-18 NOTE — ED Provider Notes (Signed)
[facility] Emergency Department Provider Note  ____________________________________________  Time seen: Approximately 4:44 PM  I have reviewed the triage vital signs and the nursing notes.   HISTORY  Chief Complaint Emesis    HPI Peter Nguyen is a 54 y.o. male with no significant past medical history who presents withvomiting since this morning (numerous times) as well as a severe headache.  He states that the headache was gradual in onset and started at about the same time the vomiting began.  He has not had any diarrhea but states that "I feel like I can go at any time".  The symptoms started in the middle the night several hours after eating his last meal, but none of the other members of his family have gotten ill.  He denies chest pain, shortness of breath, abdominal pain, dysuria.  He does state that his urine has a foul smell and that he has had complicated urinary tract infections in the past.   Past Medical History  Diagnosis Date  . Chronic kidney disease     Kidney stone  . Seasonal allergies     There are no active problems to display for this patient.   Past Surgical History  Procedure Laterality Date  . Knee arthroscopy  2003    Right  . Knee arthroscopy  1982    Right knee  . Biceps tendon repair  2003    Left  . Kidney surgery  1989    Cyst removed -Right     Current Outpatient Rx  Name  Route  Sig  Dispense  Refill  . cephALEXin (KEFLEX) 500 MG capsule   Oral   Take 1 capsule (500 mg total) by mouth 2 (two) times daily.   14 capsule   0   . cyclobenzaprine (FLEXERIL) 10 MG tablet   Oral   Take 10 mg by mouth at bedtime as needed. For neck spasms         . glucosamine-chondroitin 500-400 MG tablet   Oral   Take 3 tablets by mouth 2 (two) times daily.         Marland Kitchen lisinopril (PRINIVIL,ZESTRIL) 20 MG tablet   Oral   Take 20 mg by mouth daily.         . ondansetron (ZOFRAN) 4 MG tablet      Take 1-2 tabs by  mouth every 8 hours as needed for nausea/vomiting   30 tablet   0   . traMADol (ULTRAM) 50 MG tablet   Oral   Take 50 mg by mouth at bedtime as needed. For neck pain           Allergies Ciprofloxacin and Prednisone  Family History  Problem Relation Age of Onset  . Anesthesia problems Mother     Social History Social History  Substance Use Topics  . Smoking status: Former Smoker -- 0.20 packs/day for 1 years    Types: Cigarettes  . Smokeless tobacco: Former Systems developer    Quit date: 05/08/1986  . Alcohol Use: Yes     Comment: rarely    Review of Systems Constitutional: No fever/chills Eyes: No visual changes. ENT: No sore throat. Cardiovascular: Denies chest pain. Respiratory: Denies shortness of breath. Gastrointestinal: No abdominal pain.  Numerous episodes of vomiting.  No diarrhea.  No constipation. Genitourinary: Negative for dysuria.  Foul-smelling urine Musculoskeletal: Negative for back pain. Skin: Negative for rash. Neurological: Gradual onset of severe generalized headache.  10-point ROS otherwise negative.  ____________________________________________  PHYSICAL EXAM:  VITAL SIGNS: ED Triage Vitals  Enc Vitals Group     BP 05/18/15 1602 135/85 mmHg     Pulse Rate 05/18/15 1602 72     Resp 05/18/15 1602 18     Temp 05/18/15 1602 97.5 F (36.4 C)     Temp Source 05/18/15 1602 Oral     SpO2 05/18/15 1602 96 %     Weight 05/18/15 1547 300 lb (136.079 kg)     Height 05/18/15 1547 5\' 11"  (1.803 m)     Head Cir --      Peak Flow --      Pain Score 05/18/15 1548 2     Pain Loc --      Pain Edu? --      Excl. in [place]? --     Constitutional: Alert and oriented. Well appearing and in no acute distress though he does appear somewhat uncomfortable Eyes: Conjunctivae are normal. PERRL. EOMI. Head: Atraumatic. Nose: No congestion/rhinnorhea. Mouth/Throat: Mucous membranes are moist.  Oropharynx non-erythematous. Neck: No stridor.  No  meningismus Cardiovascular: Normal rate, regular rhythm. Grossly normal heart sounds.  Good peripheral circulation. Respiratory: Normal respiratory effort.  No retractions. Lungs CTAB. Gastrointestinal: Soft and nontender. No distention. No abdominal bruits. No CVA tenderness. Musculoskeletal: No lower extremity tenderness nor edema.  No joint effusions. Neurologic:  Normal speech and language. No gross focal neurologic deficits are appreciated.  Skin:  Skin is warm, dry and intact. No rash noted. Psychiatric: Mood and affect are normal. Speech and behavior are normal.  ____________________________________________   LABS (all labs ordered are listed, but only abnormal results are displayed)  Labs Reviewed  COMPREHENSIVE METABOLIC PANEL - Abnormal; Notable for the following:    Glucose, Bld 112 (*)    All other components within normal limits  CBC - Abnormal; Notable for the following:    WBC 11.4 (*)    All other components within normal limits  URINALYSIS COMPLETEWITH MICROSCOPIC ([facility] ONLY) - Abnormal; Notable for the following:    Color, Urine YELLOW (*)    APPearance CLOUDY (*)    Bacteria, UA RARE (*)    Squamous Epithelial / LPF 0-5 (*)    All other components within normal limits  URINE CULTURE  LIPASE, BLOOD  TROPONIN I   ____________________________________________  EKG  ED ECG REPORT I, Alex Mcmanigal, the attending physician, personally viewed and interpreted this ECG.  Date: 05/18/2015 EKG Time: 15:59 Rate: 68 Rhythm: normal sinus rhythm QRS Axis: normal Intervals: normal ST/T Wave abnormalities: normal Conduction Disutrbances: none Narrative Interpretation: unremarkable  ____________________________________________  RADIOLOGY   No results found.  ____________________________________________   PROCEDURES  Procedure(s) performed: None  Critical Care performed: No ____________________________________________   INITIAL IMPRESSION / ASSESSMENT  AND PLAN / ED COURSE  Pertinent labs & imaging results that were available during my care of the patient were reviewed by me and considered in my medical decision making (see chart for details).  I believe that a viral cause is most likely.  I do not believe that his headache represents a severe intracranial pathology; he has no neurological deficits and actually appears comfortable in spite of his history of present illness.  His vomiting is the biggest problem for him right now.  Given the relationship between a headache and the vomiting, I will treat him for both by doing a modified migraine treatment of IV fluids 1 L, Toradol 30 mg, Reglan 10 mg, Benadryl 25 mg.  His labs are unremarkable except  for a few white cells and bacteria in his urine.  I will likely treat this as a mild urinary tract infection.  I will reassess after he has had the chance for the medications to work.  ----------------------------------------- 6:57 PM on 05/18/2015 -----------------------------------------  The patient's workup was generally unremarkable except for the UTI as described above.  He has been resting/sleeping comfortably since receiving his medication.  I had an extensive discussion with his wife about outpatient follow-up in my usual customary return precautions.  She understands and agrees with the plan.  ____________________________________________  FINAL CLINICAL IMPRESSION(S) / ED DIAGNOSES  Final diagnoses:  Nonintractable migraine, unspecified migraine type  Nausea and vomiting, vomiting of unspecified type  UTI (lower urinary tract infection)      NEW MEDICATIONS STARTED DURING THIS VISIT:  New Prescriptions   CEPHALEXIN (KEFLEX) 500 MG CAPSULE    Take 1 capsule (500 mg total) by mouth 2 (two) times daily.   ONDANSETRON (ZOFRAN) 4 MG TABLET    Take 1-2 tabs by mouth every 8 hours as needed for nausea/vomiting     Hinda Kehr, MD 05/18/15 1858

## 2015-05-18 NOTE — Discharge Instructions (Signed)
As we discussed, it is unclear whether your headache was causing your vomiting (like a migraine), or vice versa, but either way your symptoms seem to have improved and your workup is otherwise reassuring.  It appears he may have a mild urinary tract infection so please take the full course of prescribed antibiotics.  Return to the emergency department if he develop new or worsening symptoms that concern you.   Migraine Headache A migraine headache is an intense, throbbing pain on one or both sides of your head. A migraine can last for 30 minutes to several hours. CAUSES  The exact cause of a migraine headache is not always known. However, a migraine may be caused when nerves in the brain become irritated and release chemicals that cause inflammation. This causes pain. Certain things may also trigger migraines, such as:  Alcohol.  Smoking.  Stress.  Menstruation.  Aged cheeses.  Foods or drinks that contain nitrates, glutamate, aspartame, or tyramine.  Lack of sleep.  Chocolate.  Caffeine.  Hunger.  Physical exertion.  Fatigue.  Medicines used to treat chest pain (nitroglycerine), birth control pills, estrogen, and some blood pressure medicines. SIGNS AND SYMPTOMS  Pain on one or both sides of your head.  Pulsating or throbbing pain.  Severe pain that prevents daily activities.  Pain that is aggravated by any physical activity.  Nausea, vomiting, or both.  Dizziness.  Pain with exposure to bright lights, loud noises, or activity.  General sensitivity to bright lights, loud noises, or smells. Before you get a migraine, you may get warning signs that a migraine is coming (aura). An aura may include:  Seeing flashing lights.  Seeing bright spots, halos, or zigzag lines.  Having tunnel vision or blurred vision.  Having feelings of numbness or tingling.  Having trouble talking.  Having muscle weakness. DIAGNOSIS  A migraine headache is often diagnosed based  on:  Symptoms.  Physical exam.  A CT scan or MRI of your head. These imaging tests cannot diagnose migraines, but they can help rule out other causes of headaches. TREATMENT Medicines may be given for pain and nausea. Medicines can also be given to help prevent recurrent migraines.  HOME CARE INSTRUCTIONS  Only take over-the-counter or prescription medicines for pain or discomfort as directed by your health care provider. The use of long-term narcotics is not recommended.  Lie down in a dark, quiet room when you have a migraine.  Keep a journal to find out what may trigger your migraine headaches. For example, write down:  What you eat and drink.  How much sleep you get.  Any change to your diet or medicines.  Limit alcohol consumption.  Quit smoking if you smoke.  Get 7-9 hours of sleep, or as recommended by your health care provider.  Limit stress.  Keep lights dim if bright lights bother you and make your migraines worse. SEEK IMMEDIATE MEDICAL CARE IF:   Your migraine becomes severe.  You have a fever.  You have a stiff neck.  You have vision loss.  You have muscular weakness or loss of muscle control.  You start losing your balance or have trouble walking.  You feel faint or pass out.  You have severe symptoms that are different from your first symptoms. MAKE SURE YOU:   Understand these instructions.  Will watch your condition.  Will get help right away if you are not doing well or get worse.   This information is not intended to replace advice given to  you by your health care provider. Make sure you discuss any questions you have with your health care provider.   Document Released: 06/24/2005 Document Revised: 07/15/2014 Document Reviewed: 03/01/2013 Elsevier Interactive Patient Education 2016 Elsevier Inc.  Nausea and Vomiting Nausea is a sick feeling that often comes before throwing up (vomiting). Vomiting is a reflex where stomach contents  come out of your mouth. Vomiting can cause severe loss of body fluids (dehydration). Children and elderly adults can become dehydrated quickly, especially if they also have diarrhea. Nausea and vomiting are symptoms of a condition or disease. It is important to find the cause of your symptoms. CAUSES   Direct irritation of the stomach lining. This irritation can result from increased acid production (gastroesophageal reflux disease), infection, food poisoning, taking certain medicines (such as nonsteroidal anti-inflammatory drugs), alcohol use, or tobacco use.  Signals from the brain.These signals could be caused by a headache, heat exposure, an inner ear disturbance, increased pressure in the brain from injury, infection, a tumor, or a concussion, pain, emotional stimulus, or metabolic problems.  An obstruction in the gastrointestinal tract (bowel obstruction).  Illnesses such as diabetes, hepatitis, gallbladder problems, appendicitis, kidney problems, cancer, sepsis, atypical symptoms of a heart attack, or eating disorders.  Medical treatments such as chemotherapy and radiation.  Receiving medicine that makes you sleep (general anesthetic) during surgery. DIAGNOSIS Your caregiver may ask for tests to be done if the problems do not improve after a few days. Tests may also be done if symptoms are severe or if the reason for the nausea and vomiting is not clear. Tests may include:  Urine tests.  Blood tests.  Stool tests.  Cultures (to look for evidence of infection).  X-rays or other imaging studies. Test results can help your caregiver make decisions about treatment or the need for additional tests. TREATMENT You need to stay well hydrated. Drink frequently but in small amounts.You may wish to drink water, sports drinks, clear broth, or eat frozen ice pops or gelatin dessert to help stay hydrated.When you eat, eating slowly may help prevent nausea.There are also some antinausea  medicines that may help prevent nausea. HOME CARE INSTRUCTIONS   Take all medicine as directed by your caregiver.  If you do not have an appetite, do not force yourself to eat. However, you must continue to drink fluids.  If you have an appetite, eat a normal diet unless your caregiver tells you differently.  Eat a variety of complex carbohydrates (rice, wheat, potatoes, bread), lean meats, yogurt, fruits, and vegetables.  Avoid high-fat foods because they are more difficult to digest.  Drink enough water and fluids to keep your urine clear or pale yellow.  If you are dehydrated, ask your caregiver for specific rehydration instructions. Signs of dehydration may include:  Severe thirst.  Dry lips and mouth.  Dizziness.  Dark urine.  Decreasing urine frequency and amount.  Confusion.  Rapid breathing or pulse. SEEK IMMEDIATE MEDICAL CARE IF:   You have blood or brown flecks (like coffee grounds) in your vomit.  You have black or bloody stools.  You have a severe headache or stiff neck.  You are confused.  You have severe abdominal pain.  You have chest pain or trouble breathing.  You do not urinate at least once every 8 hours.  You develop cold or clammy skin.  You continue to vomit for longer than 24 to 48 hours.  You have a fever. MAKE SURE YOU:   Understand these instructions.  Will watch your condition.  Will get help right away if you are not doing well or get worse.   This information is not intended to replace advice given to you by your health care provider. Make sure you discuss any questions you have with your health care provider.   Document Released: 06/24/2005 Document Revised: 09/16/2011 Document Reviewed: 11/21/2010 Elsevier Interactive Patient Education 2016 Elsevier Inc.  Urinary Tract Infection Urinary tract infections (UTIs) can develop anywhere along your urinary tract. Your urinary tract is your body's drainage system for removing  wastes and extra water. Your urinary tract includes two kidneys, two ureters, a bladder, and a urethra. Your kidneys are a pair of bean-shaped organs. Each kidney is about the size of your fist. They are located below your ribs, one on each side of your spine. CAUSES Infections are caused by microbes, which are microscopic organisms, including fungi, viruses, and bacteria. These organisms are so small that they can only be seen through a microscope. Bacteria are the microbes that most commonly cause UTIs. SYMPTOMS  Symptoms of UTIs may vary by age and gender of the patient and by the location of the infection. Symptoms in young women typically include a frequent and intense urge to urinate and a painful, burning feeling in the bladder or urethra during urination. Older women and men are more likely to be tired, shaky, and weak and have muscle aches and abdominal pain. A fever may mean the infection is in your kidneys. Other symptoms of a kidney infection include pain in your back or sides below the ribs, nausea, and vomiting. DIAGNOSIS To diagnose a UTI, your caregiver will ask you about your symptoms. Your caregiver will also ask you to provide a urine sample. The urine sample will be tested for bacteria and white blood cells. White blood cells are made by your body to help fight infection. TREATMENT  Typically, UTIs can be treated with medication. Because most UTIs are caused by a bacterial infection, they usually can be treated with the use of antibiotics. The choice of antibiotic and length of treatment depend on your symptoms and the type of bacteria causing your infection. HOME CARE INSTRUCTIONS  If you were prescribed antibiotics, take them exactly as your caregiver instructs you. Finish the medication even if you feel better after you have only taken some of the medication.  Drink enough water and fluids to keep your urine clear or pale yellow.  Avoid caffeine, tea, and carbonated beverages.  They tend to irritate your bladder.  Empty your bladder often. Avoid holding urine for long periods of time.  Empty your bladder before and after sexual intercourse.  After a bowel movement, women should cleanse from front to back. Use each tissue only once. SEEK MEDICAL CARE IF:   You have back pain.  You develop a fever.  Your symptoms do not begin to resolve within 3 days. SEEK IMMEDIATE MEDICAL CARE IF:   You have severe back pain or lower abdominal pain.  You develop chills.  You have nausea or vomiting.  You have continued burning or discomfort with urination. MAKE SURE YOU:   Understand these instructions.  Will watch your condition.  Will get help right away if you are not doing well or get worse.   This information is not intended to replace advice given to you by your health care provider. Make sure you discuss any questions you have with your health care provider.   Document Released: 04/03/2005 Document Revised: 03/15/2015 Document  Reviewed: 08/02/2011 Elsevier Interactive Patient Education [employer].

## 2015-05-18 NOTE — ED Notes (Signed)
Pt to ed with c/o vomiting since 9 am multiple times,  Pt denies diarrhea.

## 2015-05-20 LAB — URINE CULTURE
CULTURE: NO GROWTH
Special Requests: NORMAL

## 2015-12-31 ENCOUNTER — Emergency Department: Payer: BC Managed Care – PPO

## 2015-12-31 ENCOUNTER — Emergency Department
Admission: EM | Admit: 2015-12-31 | Discharge: 2015-12-31 | Disposition: A | Discharge status: Home / Self Care | Payer: BC Managed Care – PPO | Attending: Emergency Medicine | Admitting: Emergency Medicine

## 2015-12-31 ENCOUNTER — Encounter: Payer: Self-pay | Admitting: Emergency Medicine

## 2015-12-31 DIAGNOSIS — R51 Headache: Secondary | ICD-10-CM | POA: Insufficient documentation

## 2015-12-31 DIAGNOSIS — R519 Headache, unspecified: Secondary | ICD-10-CM

## 2015-12-31 DIAGNOSIS — Z79899 Other long term (current) drug therapy: Secondary | ICD-10-CM | POA: Diagnosis not present

## 2015-12-31 DIAGNOSIS — Z87891 Personal history of nicotine dependence: Secondary | ICD-10-CM | POA: Insufficient documentation

## 2015-12-31 DIAGNOSIS — N189 Chronic kidney disease, unspecified: Secondary | ICD-10-CM | POA: Insufficient documentation

## 2015-12-31 LAB — COMPREHENSIVE METABOLIC PANEL
ALBUMIN: 4.5 g/dL (ref 3.5–5.0)
ALT: 25 U/L (ref 17–63)
AST: 23 U/L (ref 15–41)
Alkaline Phosphatase: 81 U/L (ref 38–126)
Anion gap: 8 (ref 5–15)
BUN: 12 mg/dL (ref 6–20)
CALCIUM: 8.8 mg/dL — AB (ref 8.9–10.3)
CHLORIDE: 106 mmol/L (ref 101–111)
CO2: 25 mmol/L (ref 22–32)
Creatinine, Ser: 0.66 mg/dL (ref 0.61–1.24)
GFR calc Af Amer: >60 mL/min (ref >60)
GLUCOSE: 112 mg/dL — AB (ref 65–99)
POTASSIUM: 4.4 mmol/L (ref 3.5–5.1)
SODIUM: 139 mmol/L (ref 135–145)
Total Bilirubin: 0.5 mg/dL (ref 0.3–1.2)
Total Protein: 7 g/dL (ref 6.5–8.1)

## 2015-12-31 LAB — CBC
HEMATOCRIT: 43.3 % (ref 40.0–52.0)
HEMOGLOBIN: 14.7 g/dL (ref 13.0–18.0)
MCH: 31.1 pg (ref 26.0–34.0)
MCHC: 34 g/dL (ref 32.0–36.0)
MCV: 91.3 fL (ref 80.0–100.0)
Platelets: 260 10*3/uL (ref 150–440)
RBC: 4.74 MIL/uL (ref 4.40–5.90)
RDW: 13.8 % (ref 11.5–14.5)
WBC: 8.2 10*3/uL (ref 3.8–10.6)

## 2015-12-31 MED ORDER — PROCHLORPERAZINE EDISYLATE 5 MG/ML IJ SOLN
10.0000 mg | Freq: Once | INTRAMUSCULAR | Status: AC
  Administered 2015-12-31: 10 mg via INTRAVENOUS
  Filled 2015-12-31: qty 2

## 2015-12-31 MED ORDER — PROCHLORPERAZINE MALEATE 10 MG PO TABS: 10.0000 mg | ORAL_TABLET | Freq: Three times a day (TID) | ORAL | Status: DC | PRN

## 2015-12-31 MED ORDER — ONDANSETRON 4 MG PO TBDP
ORAL_TABLET | ORAL | Status: AC
  Filled 2015-12-31: qty 1

## 2015-12-31 MED ORDER — SODIUM CHLORIDE 0.9 % IV BOLUS (SEPSIS)
1000.0000 mL | Freq: Once | INTRAVENOUS | Status: AC
  Administered 2015-12-31: 1000 mL via INTRAVENOUS

## 2015-12-31 MED ORDER — ONDANSETRON 4 MG PO TBDP
4.0000 mg | ORAL_TABLET | Freq: Once | ORAL | Status: AC | PRN
  Administered 2015-12-31: 4 mg via ORAL

## 2015-12-31 NOTE — Discharge Instructions (Signed)
Please seek medical attention for any high fevers, chest pain, shortness of breath, change in behavior, persistent vomiting, bloody stool or any other new or concerning symptoms. ° ° °General Headache Without Cause °A headache is pain or discomfort felt around the head or neck area. The specific cause of a headache may not be found. There are many causes and types of headaches. A few common ones are: °· Tension headaches. °· Migraine headaches. °· Cluster headaches. °· Chronic daily headaches. °HOME CARE INSTRUCTIONS  °Watch your condition for any changes. Take these steps to help with your condition: °Managing Pain °· Take over-the-counter and prescription medicines only as told by your health care provider. °· Lie down in a dark, quiet room when you have a headache. °· If directed, apply ice to the head and neck area: °¨ Put ice in a plastic bag. °¨ Place a towel between your skin and the bag. °¨ Leave the ice on for 20 minutes, 2-3 times per day. °· Use a heating pad or hot shower to apply heat to the head and neck area as told by your health care provider. °· Keep lights dim if bright lights bother you or make your headaches worse. °Eating and Drinking °· Eat meals on a regular schedule. °· Limit alcohol use. °· Decrease the amount of caffeine you drink, or stop drinking caffeine. °General Instructions °· Keep all follow-up visits as told by your health care provider. This is important. °· Keep a headache journal to help find out what may trigger your headaches. For example, write down: °¨ What you eat and drink. °¨ How much sleep you get. °¨ Any change to your diet or medicines. °· Try massage or other relaxation techniques. °· Limit stress. °· Sit up straight, and do not tense your muscles. °· Do not use tobacco products, including cigarettes, chewing tobacco, or e-cigarettes. If you need help quitting, ask your health care provider. °· Exercise regularly as told by your health care provider. °· Sleep on a  regular schedule. Get 7-9 hours of sleep, or the amount recommended by your health care provider. °SEEK MEDICAL CARE IF:  °· Your symptoms are not helped by medicine. °· You have a headache that is different from the usual headache. °· You have nausea or you vomit. °· You have a fever. °SEEK IMMEDIATE MEDICAL CARE IF:  °· Your headache becomes severe. °· You have repeated vomiting. °· You have a stiff neck. °· You have a loss of vision. °· You have problems with speech. °· You have pain in the eye or ear. °· You have muscular weakness or loss of muscle control. °· You lose your balance or have trouble walking. °· You feel faint or pass out. °· You have confusion. °  °This information is not intended to replace advice given to you by your health care provider. Make sure you discuss any questions you have with your health care provider. °  °Document Released: 06/24/2005 Document Revised: 03/15/2015 Document Reviewed: 10/17/2014 °Elsevier Interactive Patient Education ©2016 Elsevier Inc. ° °

## 2015-12-31 NOTE — ED Notes (Signed)
Patient transported to CT 

## 2015-12-31 NOTE — ED Provider Notes (Signed)
[facility] Emergency Department Provider Note   ____________________________________________  Time seen: ~1255  I have reviewed the triage vital signs and the nursing notes.   HISTORY  Chief Complaint Headache   History limited by: Not Limited   HPI Peter Nguyen is a 55 y.o. male who presents because of headache. It is located behind his eyes. It is severe. It woke him from sleep. It started about 10 hours ago. Has been persistent. It has been accompanied by nausea without any vomiting. No blurry vision or change in vision although he does have photophobia. Denies similar headache in the past. Denies any weakness or numbness in his extremities. Recently returned from [place]. No scuba diving.   Past Medical History  Diagnosis Date  . Chronic kidney disease     Kidney stone  . Seasonal allergies     There are no active problems to display for this patient.   Past Surgical History  Procedure Laterality Date  . Knee arthroscopy  2003    Right  . Knee arthroscopy  1982    Right knee  . Biceps tendon repair  2003    Left  . Kidney surgery  1989    Cyst removed -Right     Current Outpatient Rx  Name  Route  Sig  Dispense  Refill  . cephALEXin (KEFLEX) 500 MG capsule   Oral   Take 1 capsule (500 mg total) by mouth 2 (two) times daily.   14 capsule   0   . cyclobenzaprine (FLEXERIL) 10 MG tablet   Oral   Take 10 mg by mouth at bedtime as needed. For neck spasms         . glucosamine-chondroitin 500-400 MG tablet   Oral   Take 3 tablets by mouth 2 (two) times daily.         Marland Kitchen lisinopril (PRINIVIL,ZESTRIL) 20 MG tablet   Oral   Take 20 mg by mouth daily.         . ondansetron (ZOFRAN) 4 MG tablet      Take 1-2 tabs by mouth every 8 hours as needed for nausea/vomiting   30 tablet   0   . traMADol (ULTRAM) 50 MG tablet   Oral   Take 50 mg by mouth at bedtime as needed. For neck pain           Allergies Ciprofloxacin  and Prednisone  Family History  Problem Relation Age of Onset  . Anesthesia problems Mother     Social History Social History  Substance Use Topics  . Smoking status: Former Smoker -- 0.20 packs/day for 1 years    Types: Cigarettes  . Smokeless tobacco: Former Systems developer    Quit date: 05/08/1986  . Alcohol Use: Yes     Comment: rarely    Review of Systems  Constitutional: Negative for fever. Cardiovascular: Negative for chest pain. Respiratory: Negative for shortness of breath. Gastrointestinal: Negative for abdominal pain, vomiting and diarrhea.Positive for nausea Genitourinary: Negative for dysuria. Neurological: Positive for headache headache  10-point ROS otherwise negative.  ____________________________________________   PHYSICAL EXAM:  VITAL SIGNS: ED Triage Vitals  Enc Vitals Group     BP 12/31/15 1204 147/83 mmHg     Pulse Rate 12/31/15 1204 62     Resp 12/31/15 1204 20     Temp 12/31/15 1204 97.6 F (36.4 C)     Temp Source 12/31/15 1204 Oral     SpO2 12/31/15 1204 94 %  Weight 12/31/15 1204 300 lb (136.079 kg)     Height 12/31/15 1204 5\' 11"  (1.803 m)     Head Cir --      Peak Flow --      Pain Score 12/31/15 1205 2   Constitutional: Alert and oriented. Appears uncomfortable. Eyes: Conjunctivae are normal. PERRL. Normal extraocular movements. ENT   Head: Normocephalic and atraumatic.   Nose: No congestion/rhinnorhea.   Mouth/Throat: Mucous membranes are moist.   Neck: No stridor. Hematological/Lymphatic/Immunilogical: No cervical lymphadenopathy. Cardiovascular: Normal rate, regular rhythm.  No murmurs, rubs, or gallops. Respiratory: Normal respiratory effort without tachypnea nor retractions. Breath sounds are clear and equal bilaterally. No wheezes/rales/rhonchi. Gastrointestinal: Soft and nontender. No distention. There is no CVA tenderness. Genitourinary: Deferred Musculoskeletal: Normal range of motion in all extremities. No joint  effusions.  No lower extremity tenderness nor edema. Neurologic:  Normal speech and language. No gross focal neurologic deficits are appreciated.  Skin:  Skin is warm, dry and intact. No rash noted. Psychiatric: Mood and affect are normal. Speech and behavior are normal. Patient exhibits appropriate insight and judgment.  ____________________________________________    LABS (pertinent positives/negatives)  Labs Reviewed  COMPREHENSIVE METABOLIC PANEL - Abnormal; Notable for the following:    Glucose, Bld 112 (*)    Calcium 8.8 (*)    All other components within normal limits  CBC     ____________________________________________   EKG  None  ____________________________________________    RADIOLOGY  CT head  IMPRESSION: Unremarkable head CT.  ____________________________________________   PROCEDURES  Procedure(s) performed: None  Critical Care performed: No  ____________________________________________   INITIAL IMPRESSION / ASSESSMENT AND PLAN / ED COURSE  Pertinent labs & imaging results that were available during my care of the patient were reviewed by me and considered in my medical decision making (see chart for details).  Patient presented to the emergency department today because of concerns for frontal headache. On exam patient does appear somewhat uncomfortable. Will get a CT scan. Additionally will give patient fluids and medication to try to help symptoms. Will reassess.  ----------------------------------------- 2:33 PM on 12/31/2015 -----------------------------------------  Patient states that his headache has improved significantly after medication. CT head negative. I did discuss possibility of head bleed although I think this is remote. This time patient was not interested in undergoing lumbar puncture to evaluate. Think this is certainly reasonable given negative head CT.   ____________________________________________   FINAL CLINICAL  IMPRESSION(S) / ED DIAGNOSES  Final diagnoses:  Headache, unspecified headache type     Note: This dictation was prepared with Dragon dictation. Any transcriptional errors that result from this process are unintentional    Nance Pear, MD 12/31/15 1434

## 2015-12-31 NOTE — ED Notes (Addendum)
Pt reports having a headache that woke him from his sleep at 600 this morning. Pt reports a history of migraines similar to this but verbalized "they have never lasted this long". Wife reports giving pt Advil migraine at 1000 this morning with no relief. Pt denies changes in vision or dizziness at this time. Pt verbalized having nausea but denies vomiting. Pt reports he did not sleep well this past week because he was in [place].

## 2015-12-31 NOTE — ED Notes (Signed)
Patient states he just returned from [place] (returned today), having severe headache now. Denies history of severe headaches or migraines.

## 2019-01-04 ENCOUNTER — Ambulatory Visit (INDEPENDENT_AMBULATORY_CARE_PROVIDER_SITE_OTHER): Payer: BC Managed Care – PPO | Admitting: Family Medicine

## 2019-01-04 ENCOUNTER — Encounter: Payer: Self-pay | Admitting: Family Medicine

## 2019-01-04 DIAGNOSIS — Z114 Encounter for screening for human immunodeficiency virus [HIV]: Secondary | ICD-10-CM

## 2019-01-04 DIAGNOSIS — Z807 Family history of other malignant neoplasms of lymphoid, hematopoietic and related tissues: Secondary | ICD-10-CM | POA: Diagnosis not present

## 2019-01-04 DIAGNOSIS — Z1159 Encounter for screening for other viral diseases: Secondary | ICD-10-CM

## 2019-01-04 DIAGNOSIS — Z1211 Encounter for screening for malignant neoplasm of colon: Secondary | ICD-10-CM | POA: Diagnosis not present

## 2019-01-04 NOTE — Patient Instructions (Addendum)
The CDC recommends two doses of Shingrix (the shingles vaccine) separated by 2 to 6 months for adults age 57 years and older. I recommend checking with your insurance plan regarding coverage for this vaccine.     Diet Recommendations for Diabetes   Starchy (carb) foods include: Bread, rice, pasta, potatoes, corn, crackers, bagels, muffins, all baked goods.  (Fruits, milk, and yogurt also have carbohydrate, but most of these foods will not spike your blood sugar as the starchy foods will.)  A few fruits do cause high blood sugars; use small portions of bananas (limit to 1/2 at a time), grapes, watermelon, and most tropical fruits.    Protein foods include: Meat, fish, poultry, eggs, dairy foods, and beans such as pinto and kidney beans (beans also provide carbohydrate).   1. Limit starchy foods to TWO per meal and ONE per snack. ONE portion of a starchy  food is equal to the following:   - ONE slice of bread (or its equivalent, such as half of a hamburger bun).   - 1/2 cup of a "scoopable" starchy food such as potatoes or rice.   - 15 grams of carbohydrate as shown on food label.  2. Include at every meal: a protein food, a carb food, and vegetables and/or fruit.   - Obtain twice as many veg's as protein or carbohydrate foods for both lunch and dinner.   - Fresh or frozen veg's are best.   - Try to keep frozen veg's on hand for a quick vegetable serving.         Obesity, Adult Obesity is having too much body fat. Being obese means that your weight is more than what is healthy for you. BMI is a number that explains how much body fat you have. If you have a BMI of 30 or more, you are obese. Obesity is often caused by eating or drinking more calories than your body uses. Changing your lifestyle can help you lose weight. Obesity can cause serious health problems, such as:  Stroke.  Coronary artery disease (CAD).  Type 2 diabetes.  Some types of cancer, including cancers of the colon,  breast, uterus, and gallbladder.  Osteoarthritis.  High blood pressure (hypertension).  High cholesterol.  Sleep apnea.  Gallbladder stones.  Infertility problems. What are the causes?  Eating meals each day that are high in calories, sugar, and fat.  Being born with genes that may make you more likely to become obese.  Having a medical condition that causes obesity.  Taking certain medicines.  Sitting a lot (having a sedentary lifestyle).  Not getting enough sleep.  Drinking a lot of drinks that have sugar in them. What increases the risk?  Having a family history of obesity.  Being an [place] American woman.  Being a Hispanic man.  Living in an area with limited access to: ? Romilda Garret, recreation centers, or sidewalks. ? Healthy food choices, such as grocery stores and farmers' markets. What are the signs or symptoms? The main sign is having too much body fat. How is this treated?  Treatment for this condition often includes changing your lifestyle. Treatment may include: ? Changing your diet. This may include making a healthy meal plan. ? Exercise. This may include activity that causes your heart to beat faster (aerobic exercise) and strength training. Work with your doctor to design a program that works for you. ? Medicine to help you lose weight. This may be used if you are not able to lose  1 pound a week after 6 weeks of healthy eating and more exercise. ? Treating conditions that cause the obesity. ? Surgery. Options may include gastric banding and gastric bypass. This may be done if:  Other treatments have not helped to improve your condition.  You have a BMI of 40 or higher.  You have life-threatening health problems related to obesity. Follow these instructions at home: Eating and drinking   Follow advice from your doctor about what to eat and drink. Your doctor may tell you to: ? Limit fast food, sweets, and processed snack foods. ? Choose low-fat  options. For example, choose low-fat milk instead of whole milk. ? Eat 5 or more servings of fruits or vegetables each day. ? Eat at home more often. This gives you more control over what you eat. ? Choose healthy foods when you eat out. ? Learn to read food labels. This will help you learn how much food is in 1 serving. ? Keep low-fat snacks available. ? Avoid drinks that have a lot of sugar in them. These include soda, fruit juice, iced tea with sugar, and flavored milk.  Drink enough water to keep your pee (urine) pale yellow.  Do not go on fad diets. Physical activity  Exercise often, as told by your doctor. Most adults should get up to 150 minutes of moderate-intensity exercise every week.Ask your doctor: ? What types of exercise are safe for you. ? How often you should exercise.  Warm up and stretch before being active.  Do slow stretching after being active (cool down).  Rest between times of being active. Lifestyle  Work with your doctor and a food expert (dietitian) to set a weight-loss goal that is best for you.  Limit your screen time.  Find ways to reward yourself that do not involve food.  Do not drink alcohol if: ? Your doctor tells you not to drink. ? You are pregnant, may be pregnant, or are planning to become pregnant.  If you drink alcohol: ? Limit how much you use to:  0-1 drink a day for women.  0-2 drinks a day for men. ? Be aware of how much alcohol is in your drink. In the [place] one drink equals one 12 oz bottle of beer (355 mL), one 5 oz glass of wine (148 mL), or one 1 oz glass of hard liquor (44 mL). General instructions  Keep a weight-loss journal. This can help you keep track of: ? The food that you eat. ? How much exercise you get.  Take over-the-counter and prescription medicines only as told by your doctor.  Take vitamins and supplements only as told by your doctor.  Think about joining a support group.  Keep all follow-up visits as  told by your doctor. This is important. Contact a doctor if:  You cannot meet your weight loss goal after you have changed your diet and lifestyle for 6 weeks. Get help right away if you:  Are having trouble breathing.  Are having thoughts of harming yourself. Summary  Obesity is having too much body fat.  Being obese means that your weight is more than what is healthy for you.  Work with your doctor to set a weight-loss goal.  Get regular exercise as told by your doctor. This information is not intended to replace advice given to you by your health care provider. Make sure you discuss any questions you have with your health care provider. Document Released: 09/16/2011 Document Revised: 02/26/2018 Document Reviewed:  02/26/2018 Elsevier Patient Education  2020 [employer].

## 2019-01-04 NOTE — Progress Notes (Signed)
Patient: Peter Nguyen, Male    DOB: [birth year], 58 y.o.   MRN: [id number] Visit Date: 01/04/2019  Today's Provider: Lavon Paganini, MD   Chief Complaint  Patient presents with  . Establish Care   Subjective:    Virtual Visit via Video Note  I connected with Reggie Pile Ogarro on 01/04/19 at  9:00 AM EDT by a video enabled telemedicine application and verified that I am speaking with the correct person using two identifiers.  Patient location: home Provider location: [place] involved in the visit: patient, provider    I discussed the limitations of evaluation and management by telemedicine and the availability of in person appointments. The patient expressed understanding and agreed to proceed.    New Patient:  Peter Nguyen is a 58 y.o. male who presents today to Establish Care.  He feels well. He reports exercising lightly. He reports he is sleeping poorly.  States that he has not seen a PCP in many years.  Last time that he went ot PCP, he was trying to lose weight and he did not want to take a pill for this.  He would like to do this holistically.  Gained ~80 lbs as he was in his car all day for work for ~13 years.  He has lost ~20 lbs trying to change diet and be more active.  Doing intermittent fasting 16/8.  Sometimes longer 24 hr fasts.  Has taken snacks out of the house.  He has never had a colonoscopy and is being "harassed" by many friends and his mother (who has h/o multiple primary cancers) about getting one.  Had h/o kidney stones.  >6-8 years ago.  Drinking cherry juice and more water.  Allergic rhinitis - allergic to dogs/cats/mammals. Pollen in spring and fall. Takes antihistamine prn.  Has intermittent pain in R knee. S/p arthroscopy x4 since the 1980s for degenerative meniscal tear and MCL tear. Has numbness in L thumb due to previous C spine disc issue s/p surgical decompression  -----------------------------------------------------------------   Review of Systems  Constitutional: Negative.   HENT: Negative.   Eyes: Negative.   Respiratory: Negative.   Cardiovascular: Negative.   Gastrointestinal: Negative.   Endocrine: Negative.   Genitourinary: Negative.   Musculoskeletal: Negative.   Skin: Negative.   Allergic/Immunologic: Negative.   Neurological: Negative.   Hematological: Negative.   Psychiatric/Behavioral: Positive for sleep disturbance.    Social History      He  reports that he has quit smoking. His smoking use included cigarettes. He has a 0.20 pack-year smoking history. He quit smokeless tobacco use about 32 years ago. He reports current alcohol use. He reports that he does not use drugs.       Social History   Socioeconomic History  . Marital status: Married    Spouse name: Not on file  . Number of children: Not on file  . Years of education: Not on file  . Highest education level: Not on file  Occupational History  . Not on file  Social Needs  . Financial resource strain: Not on file  . Food insecurity    Worry: Not on file    Inability: Not on file  . Transportation needs    Medical: Not on file    Non-medical: Not on file  Tobacco Use  . Smoking status: Former Smoker    Packs/day: 0.20    Years: 1.00    Pack years: 0.20  Types: Cigarettes  . Smokeless tobacco: Former Systems developer    Quit date: 05/08/1986  Substance and Sexual Activity  . Alcohol use: Yes    Comment: rarely  . Drug use: No  . Sexual activity: Yes    Partners: Female  Lifestyle  . Physical activity    Days per week: Not on file    Minutes per session: Not on file  . Stress: Not on file  Relationships  . Social Herbalist on phone: Not on file    Gets together: Not on file    Attends religious service: Not on file    Active member of club or organization: Not on file    Attends meetings of clubs or organizations: Not on file    Relationship  status: Not on file  Other Topics Concern  . Not on file  Social History Narrative  . Not on file    Past Medical History:  Diagnosis Date  . Collar bone fracture   . History of kidney stones   . Obesity   . Seasonal allergies      Patient Active Problem List   Diagnosis Date Noted  . Morbid obesity ([place]) 01/04/2019    Past Surgical History:  Procedure Laterality Date  . BICEPS TENDON REPAIR  2003   Left  . KIDNEY SURGERY  1989   Cyst removed -Right   . KNEE ARTHROSCOPY  2003   Right x4  . KNEE ARTHROSCOPY  1982   Right knee  . NECK SURGERY     C4-6     Family History        Family Status  Relation Name Status  . Mother  Alive  . Father  Alive  . Brother  Alive  . Daughter  Alive  . Son  Alive  . MGM  Deceased at age 11  . MGF  Deceased  . PGM  Deceased  . PGF  Deceased        His family history includes Anesthesia problems in his mother; Anxiety disorder in his daughter; Cancer in his mother; Emphysema in his paternal grandfather; Hypotension in his mother; Mesothelioma in his maternal grandfather; Multiple sclerosis in his brother.      Allergies  Allergen Reactions  . Ciprofloxacin Hives and Itching  . Prednisone Other (See Comments)    Sweats, feels bad, and other "weird things"     Current Outpatient Medications:  Marland Kitchen  Multiple Vitamin (MULTI-VITAMIN) tablet, Take by mouth., Disp: , Rfl:    Patient Care Team: Virginia Crews, MD as PCP - General (Family Medicine)    Objective:    Vitals: Ht 5\' 11"  (1.803 m)   Wt 300 lb (136.1 kg)   BMI 41.84 kg/m    Vitals:   01/04/19 0818  Weight: 300 lb (136.1 kg)  Height: 5\' 11"  (1.803 m)     Physical Exam Constitutional:      General: He is not in acute distress.    Appearance: Normal appearance.  HENT:     Head: Normocephalic and atraumatic.  Pulmonary:     Effort: Pulmonary effort is normal. No respiratory distress.  Neurological:     Mental Status: He is alert and oriented to  person, place, and time.  Psychiatric:        Mood and Affect: Mood normal.        Behavior: Behavior normal.      Depression Screen PHQ 2/9 Scores 01/04/2019  PHQ - 2 Score 0  Assessment & Plan:   I discussed the assessment and treatment plan with the patient. The patient was provided an opportunity to ask questions and all were answered. The patient agreed with the plan and demonstrated an understanding of the instructions.   The patient was advised to call back or seek an in-person evaluation if the symptoms worsen or if the condition fails to improve as anticipated.  I provided 45 minutes of non-face-to-face time during this encounter.    Establish Care  Exercise Activities and Dietary recommendations Goals   None      There is no immunization history on file for this patient.  Health Maintenance  Topic Date Due  . Hepatitis C Screening  April 07, 1961  . HIV Screening  02/07/1977  . TETANUS/TDAP  02/07/1981  . COLONOSCOPY  02/08/2012  . INFLUENZA VACCINE  02/06/2019     Discussed health benefits of physical activity, and encouraged him to engage in regular exercise appropriate for his age and condition.    --------------------------------------------------------------------  Problem List Items Addressed This Visit      Other   Morbid obesity ([place]) - Primary    Long discussion regarding importance of weight loss and healthy weight management Not interested in medications or bariatric surgery Discussed lifestyle management Recommended low carb diet and intermittent fasting Push exercise Screening labs F/u in 3-6 months at CPE Consider referral to nutritionist in the future      Relevant Orders   Lipid panel   CBC w/Diff/Platelet   Comprehensive metabolic panel   TSH    Other Visit Diagnoses    Screen for colon cancer       Relevant Orders   Ambulatory referral to Gastroenterology   Need for hepatitis C screening test       Relevant Orders    Hepatitis C Antibody   Family history of lymphoma       Relevant Orders   CBC w/Diff/Platelet   Screening for HIV (human immunodeficiency virus)       Relevant Orders   HIV antibody (with reflex)       Return in about 6 months (around 07/06/2019) for CPE.   The entirety of the information documented in the History of Present Illness, Review of Systems and Physical Exam were personally obtained by me. Portions of this information were initially documented by Tiburcio Pea, CMA and reviewed by me for thoroughness and accuracy.    Bacigalupo, Dionne Bucy, MD MPH [facility]

## 2019-01-04 NOTE — Assessment & Plan Note (Signed)
Long discussion regarding importance of weight loss and healthy weight management Not interested in medications or bariatric surgery Discussed lifestyle management Recommended low carb diet and intermittent fasting Push exercise Screening labs F/u in 3-6 months at CPE Consider referral to nutritionist in the future

## 2019-01-06 ENCOUNTER — Encounter: Payer: Self-pay | Admitting: Family Medicine

## 2019-01-13 ENCOUNTER — Encounter: Payer: Self-pay | Admitting: Family Medicine

## 2019-01-13 ENCOUNTER — Ambulatory Visit (INDEPENDENT_AMBULATORY_CARE_PROVIDER_SITE_OTHER): Payer: BC Managed Care – PPO | Admitting: Family Medicine

## 2019-01-13 ENCOUNTER — Other Ambulatory Visit: Payer: Self-pay

## 2019-01-13 DIAGNOSIS — Z23 Encounter for immunization: Secondary | ICD-10-CM | POA: Diagnosis not present

## 2019-01-13 NOTE — Progress Notes (Signed)
Patient here for Shingrix vaccination only.  I did not examine the patient.  I did review his medical history, medications, and allergies and vaccine consent form.  CMA gave vaccination. Patient tolerated well.  Virginia Crews, MD, MPH [facility] 01/13/2019 4:53 PM

## 2019-02-03 ENCOUNTER — Encounter: Payer: Self-pay | Admitting: *Deleted

## 2019-02-03 ENCOUNTER — Telehealth: Payer: Self-pay | Admitting: Gastroenterology

## 2019-02-03 NOTE — Telephone Encounter (Signed)
Pt left vm to schedule a colonoscopy   °

## 2019-02-04 ENCOUNTER — Telehealth: Payer: Self-pay | Admitting: Gastroenterology

## 2019-02-04 ENCOUNTER — Encounter: Payer: Self-pay | Admitting: *Deleted

## 2019-02-04 ENCOUNTER — Other Ambulatory Visit: Payer: Self-pay

## 2019-02-04 DIAGNOSIS — Z1211 Encounter for screening for malignant neoplasm of colon: Secondary | ICD-10-CM

## 2019-02-04 MED ORDER — NA SULFATE-K SULFATE-MG SULF 17.5-3.13-1.6 GM/177ML PO SOLN: 1.0000 | Freq: Once | ORAL | 0 refills | Status: AC

## 2019-02-04 NOTE — Telephone Encounter (Signed)
Gastroenterology Pre-Procedure Review  Request Date: Thursday 02/11/2019   [facility]  Requesting Physician: Dr. Vicente Males   PATIENT REVIEW QUESTIONS: The patient responded to the following health history questions as indicated:    1. Are you having any GI issues? no 2. Do you have a personal history of Polyps? no 3. Do you have a family history of Colon Cancer or Polyps? no 4. Diabetes Mellitus? no 5. Joint replacements in the past 12 months?no 6. Major health problems in the past 3 months?no 7. Any artificial heart valves, MVP, or defibrillator?no    MEDICATIONS & ALLERGIES:    Patient reports the following regarding taking any anticoagulation/antiplatelet therapy:   Plavix, Coumadin, Eliquis, Xarelto, Lovenox, Pradaxa, Brilinta, or Effient? no Aspirin? no  Patient confirms/reports the following medications:  Current Outpatient Medications  Medication Sig Dispense Refill  . Multiple Vitamin (MULTI-VITAMIN) tablet Take by mouth.     No current facility-administered medications for this visit.     Patient confirms/reports the following allergies:  Allergies  Allergen Reactions  . Ciprofloxacin Hives and Itching  . Prednisone Other (See Comments)    Sweats, feels bad, and other "weird things"    No orders of the defined types were placed in this encounter.   AUTHORIZATION INFORMATION Primary Insurance: 1D#: Group #:  Secondary Insurance: 1D#: Group #:  SCHEDULE INFORMATION: Date:  Time: Location:

## 2019-02-08 ENCOUNTER — Other Ambulatory Visit
Admission: RE | Admit: 2019-02-08 | Discharge: 2019-02-08 | Disposition: A | Discharge status: Home / Self Care | Payer: BC Managed Care – PPO | Source: Ambulatory Visit | Attending: Gastroenterology | Admitting: Gastroenterology

## 2019-02-08 ENCOUNTER — Other Ambulatory Visit: Payer: Self-pay

## 2019-02-08 DIAGNOSIS — Z01812 Encounter for preprocedural laboratory examination: Secondary | ICD-10-CM | POA: Diagnosis present

## 2019-02-08 DIAGNOSIS — Z20828 Contact with and (suspected) exposure to other viral communicable diseases: Secondary | ICD-10-CM | POA: Diagnosis not present

## 2019-02-08 LAB — SARS CORONAVIRUS 2 (TAT 6-24 HRS): SARS Coronavirus 2: NEGATIVE

## 2019-02-10 ENCOUNTER — Encounter: Payer: Self-pay | Admitting: *Deleted

## 2019-02-11 ENCOUNTER — Other Ambulatory Visit: Payer: Self-pay

## 2019-02-11 ENCOUNTER — Encounter: Payer: Self-pay | Admitting: Anesthesiology

## 2019-02-11 ENCOUNTER — Encounter
Admission: RE | Disposition: A | Discharge status: Home / Self Care | Payer: Self-pay | Source: Home / Self Care | Attending: Gastroenterology

## 2019-02-11 ENCOUNTER — Ambulatory Visit
Admission: RE | Admit: 2019-02-11 | Discharge: 2019-02-11 | Disposition: A | Discharge status: Home / Self Care | Payer: BC Managed Care – PPO | Attending: Gastroenterology | Admitting: Gastroenterology

## 2019-02-11 ENCOUNTER — Ambulatory Visit: Payer: BC Managed Care – PPO | Admitting: Certified Registered Nurse Anesthetist

## 2019-02-11 DIAGNOSIS — D123 Benign neoplasm of transverse colon: Secondary | ICD-10-CM | POA: Diagnosis not present

## 2019-02-11 DIAGNOSIS — Z87891 Personal history of nicotine dependence: Secondary | ICD-10-CM | POA: Insufficient documentation

## 2019-02-11 DIAGNOSIS — K5733 Diverticulitis of large intestine without perforation or abscess with bleeding: Secondary | ICD-10-CM

## 2019-02-11 DIAGNOSIS — E669 Obesity, unspecified: Secondary | ICD-10-CM | POA: Diagnosis not present

## 2019-02-11 DIAGNOSIS — D125 Benign neoplasm of sigmoid colon: Secondary | ICD-10-CM | POA: Insufficient documentation

## 2019-02-11 DIAGNOSIS — Z6841 Body Mass Index (BMI) 40.0 and over, adult: Secondary | ICD-10-CM | POA: Insufficient documentation

## 2019-02-11 DIAGNOSIS — D120 Benign neoplasm of cecum: Secondary | ICD-10-CM | POA: Insufficient documentation

## 2019-02-11 DIAGNOSIS — K635 Polyp of colon: Secondary | ICD-10-CM

## 2019-02-11 DIAGNOSIS — Z1211 Encounter for screening for malignant neoplasm of colon: Secondary | ICD-10-CM

## 2019-02-11 DIAGNOSIS — K5730 Diverticulosis of large intestine without perforation or abscess without bleeding: Secondary | ICD-10-CM | POA: Diagnosis not present

## 2019-02-11 DIAGNOSIS — Z881 Allergy status to other antibiotic agents status: Secondary | ICD-10-CM | POA: Insufficient documentation

## 2019-02-11 DIAGNOSIS — Z888 Allergy status to other drugs, medicaments and biological substances status: Secondary | ICD-10-CM | POA: Insufficient documentation

## 2019-02-11 HISTORY — PX: COLONOSCOPY WITH PROPOFOL: SHX5780

## 2019-02-11 SURGERY — COLONOSCOPY WITH PROPOFOL
Anesthesia: General

## 2019-02-11 MED ORDER — PROPOFOL 10 MG/ML IV BOLUS
INTRAVENOUS | Status: DC | PRN
  Administered 2019-02-11: 30 mg via INTRAVENOUS
  Administered 2019-02-11: 20 mg via INTRAVENOUS
  Administered 2019-02-11: 30 mg via INTRAVENOUS
  Administered 2019-02-11: 20 mg via INTRAVENOUS
  Administered 2019-02-11: 30 mg via INTRAVENOUS
  Administered 2019-02-11: 50 mg via INTRAVENOUS
  Administered 2019-02-11: 20 mg via INTRAVENOUS

## 2019-02-11 MED ORDER — SODIUM CHLORIDE 0.9 % IV SOLN
INTRAVENOUS | Status: DC
  Administered 2019-02-11: 1000 mL via INTRAVENOUS

## 2019-02-11 MED ORDER — PROPOFOL 500 MG/50ML IV EMUL
INTRAVENOUS | Status: AC
  Filled 2019-02-11: qty 50

## 2019-02-11 MED ORDER — PROPOFOL 500 MG/50ML IV EMUL
INTRAVENOUS | Status: DC | PRN
  Administered 2019-02-11: 175 ug/kg/min via INTRAVENOUS

## 2019-02-11 MED ORDER — LIDOCAINE HCL (CARDIAC) PF 100 MG/5ML IV SOSY
PREFILLED_SYRINGE | INTRAVENOUS | Status: DC | PRN
  Administered 2019-02-11: 50 mg via INTRAVENOUS

## 2019-02-11 NOTE — H&P (Signed)
Vonda Antigua, MD [address], [place], [place], [place], [address] [address], [place], [place], [place], [address] Phone: [phone]  Fax: [phone]  Primary Care Physician:  Virginia Crews, MD   Pre-Procedure History & Physical: HPI:  Peter Nguyen is a 58 y.o. male is here for a colonoscopy.   Past Medical History:  Diagnosis Date  . Collar bone fracture   . History of kidney stones   . Obesity   . Seasonal allergies     Past Surgical History:  Procedure Laterality Date  . BICEPS TENDON REPAIR  2003   Left  . KIDNEY SURGERY  1989   Cyst removed -Right   . KNEE ARTHROSCOPY  2003   Right x4  . KNEE ARTHROSCOPY  1982   Right knee  . NECK SURGERY     C4-6     Prior to Admission medications   Medication Sig Start Date End Date Taking? Authorizing Provider  Multiple Vitamin (MULTI-VITAMIN) tablet Take by mouth.   Yes [provider]    Allergies as of 02/05/2019 - Review Complete 01/13/2019  Allergen Reaction Noted  . Ciprofloxacin Hives and Itching 09/18/2011  . Prednisone Other (See Comments) 09/18/2011    Family History  Problem Relation Age of Onset  . Anesthesia problems Mother   . Hypotension Mother   . Cancer Mother        breast, lung, lymphoma  . Multiple sclerosis Brother   . Anxiety disorder Daughter   . Mesothelioma Maternal Grandfather        asbestos exposure  . Emphysema Paternal Grandfather     Social History   Socioeconomic History  . Marital status: Married    Spouse name: Not on file  . Number of children: Not on file  . Years of education: Not on file  . Highest education level: Not on file  Occupational History  . Not on file  Social Needs  . Financial resource strain: Not on file  . Food insecurity    Worry: Not on file    Inability: Not on file  . Transportation needs    Medical: Not on file    Non-medical: Not on file  Tobacco Use  . Smoking status: Former Smoker    Packs/day: 0.20   Years: 1.00    Pack years: 0.20    Types: Cigarettes  . Smokeless tobacco: Former Systems developer    Quit date: 05/08/1986  Substance and Sexual Activity  . Alcohol use: Yes    Comment: rarely  . Drug use: No  . Sexual activity: Yes    Partners: Female  Lifestyle  . Physical activity    Days per week: Not on file    Minutes per session: Not on file  . Stress: Not on file  Relationships  . Social Herbalist on phone: Not on file    Gets together: Not on file    Attends religious service: Not on file    Active member of club or organization: Not on file    Attends meetings of clubs or organizations: Not on file    Relationship status: Not on file  . Intimate partner violence    Fear of current or ex partner: Not on file    Emotionally abused: Not on file    Physically abused: Not on file    Forced sexual activity: Not on file  Other Topics Concern  . Not on file  Social History Narrative  . Not  on file    Review of Systems: See HPI, otherwise negative ROS  Physical Exam: BP (!) 165/105   Pulse 82   Temp (!) 97.3 F (36.3 C) (Tympanic)   Resp 20   Ht 5\' 11"  (1.803 m)   Wt 131.5 kg   SpO2 97%   BMI 40.45 kg/m  General:   Alert,  pleasant and cooperative in NAD Head:  Normocephalic and atraumatic. Neck:  Supple; no masses or thyromegaly. Lungs:  Clear throughout to auscultation, normal respiratory effort.    Heart:  +S1, +S2, Regular rate and rhythm, No edema. Abdomen:  Soft, nontender and nondistended. Normal bowel sounds, without guarding, and without rebound.   Neurologic:  Alert and  oriented x4;  grossly normal neurologically.  Impression/Plan: Peter Nguyen is here for a colonoscopy to be performed for average risk screening.  Risks, benefits, limitations, and alternatives regarding  colonoscopy have been reviewed with the patient.  Questions have been answered.  All parties agreeable.   Virgel Manifold, MD  02/11/2019, 9:45 AM

## 2019-02-11 NOTE — Anesthesia Preprocedure Evaluation (Signed)
Anesthesia Evaluation  Patient identified by MRN, date of birth, ID band Patient awake    Reviewed: Allergy & Precautions, H&P , NPO status , Patient's Chart, lab work & pertinent test results  Airway Mallampati: II  TM Distance: >3 FB Neck ROM: limited    Dental  (+) Dental Advidsory Given   Pulmonary neg pulmonary ROS, former smoker,    breath sounds clear to auscultation       Cardiovascular hypertension, On Medications  Rhythm:Regular Rate:Normal     Neuro/Psych negative neurological ROS  negative psych ROS   GI/Hepatic negative GI ROS, (+) Hepatitis -, A  Endo/Other  negative endocrine ROSMorbid obesity  Renal/GU   negative genitourinary   Musculoskeletal   Abdominal   Peds negative pediatric ROS (+)  Hematology negative hematology ROS (+)   Anesthesia Other Findings Past Medical History: No date: Collar bone fracture No date: History of kidney stones No date: Obesity No date: Seasonal allergies  Reproductive/Obstetrics                             Anesthesia Physical  Anesthesia Plan  ASA: III  Anesthesia Plan: General   Post-op Pain Management:    Induction: Intravenous  PONV Risk Score and Plan:   Airway Management Planned: Nasal Cannula  Additional Equipment: CVP and Arterial line  Intra-op Plan:   Post-operative Plan:   Informed Consent:     Dental Advisory Given  Plan Discussed with: Anesthesiologist, CRNA and Surgeon  Anesthesia Plan Comments:         Anesthesia Quick Evaluation

## 2019-02-11 NOTE — Transfer of Care (Signed)
Immediate Anesthesia Transfer of Care Note  Patient: Peter Nguyen  Procedure(s) Performed: COLONOSCOPY WITH PROPOFOL (N/A )  Patient Location: PACU  Anesthesia Type:General  Level of Consciousness: awake and alert   Airway & Oxygen Therapy: Patient Spontanous Breathing and Patient connected to nasal cannula oxygen  Post-op Assessment: Report given to RN and Post -op Vital signs reviewed and stable  Post vital signs: Reviewed and stable  Last Vitals:  Vitals Value Taken Time  BP 112/67 02/11/19 1101  Temp    Pulse 78 02/11/19 1103  Resp 18 02/11/19 1103  SpO2 98 % 02/11/19 1103  Vitals shown include unvalidated device data.  Last Pain:  Vitals:   02/11/19 1050  TempSrc: Tympanic  PainSc:          Complications: No apparent anesthesia complications

## 2019-02-11 NOTE — Anesthesia Procedure Notes (Signed)
Date/Time: 02/11/2019 9:56 AM Performed by: Johnna Acosta, CRNA Pre-anesthesia Checklist: Patient identified, Emergency Drugs available, Suction available, Patient being monitored and Timeout performed Patient Re-evaluated:Patient Re-evaluated prior to induction Oxygen Delivery Method: Nasal cannula Preoxygenation: Pre-oxygenation with 100% oxygen Induction Type: IV induction

## 2019-02-11 NOTE — Anesthesia Post-op Follow-up Note (Signed)
Anesthesia QCDR form completed.        

## 2019-02-11 NOTE — Op Note (Signed)
[facility] Gastroenterology Patient Name: Peter Nguyen Procedure Date: 02/11/2019 9:43 AM MRN: [id number] Account #: [id number] Date of Birth: [birth year] Admit Type: Outpatient Age: 58 Room: [facility] ENDO ROOM 3 Gender: Male Note Status: Finalized Procedure:            Colonoscopy Indications:          Screening for colorectal malignant neoplasm Providers:            Bianca Raneri B. Bonna Gains MD, MD Referring MD:         Dionne Bucy. Bacigalupo (Referring MD) Medicines:            Monitored Anesthesia Care Complications:        No immediate complications. Procedure:            Pre-Anesthesia Assessment:                       - ASA Grade Assessment: II - A patient with mild                        systemic disease.                       - Prior to the procedure, a History and Physical was                        performed, and patient medications, allergies and                        sensitivities were reviewed. The patient's tolerance of                        previous anesthesia was reviewed.                       - The risks and benefits of the procedure and the                        sedation options and risks were discussed with the                        patient. All questions were answered and informed                        consent was obtained.                       - Patient identification and proposed procedure were                        verified prior to the procedure by the physician, the                        nurse, the anesthesiologist, the anesthetist and the                        technician. The procedure was verified in the procedure                        room.  After obtaining informed consent, the colonoscope was                        passed under direct vision. Throughout the procedure,                        the patient's blood pressure, pulse, and oxygen                        saturations were monitored continuously. The                 Colonoscope was introduced through the anus and                        advanced to the the cecum, identified by appendiceal                        orifice and ileocecal valve. The colonoscopy was                        performed with ease. The patient tolerated the                        procedure well. The quality of the bowel preparation                        was good. Findings:      The perianal and digital rectal examinations were normal.      Six sessile polyps were found in the sigmoid colon, transverse colon and       cecum. The polyps were 3 to 4 mm in size. These polyps were removed with       a cold biopsy forceps. Resection and retrieval were complete.      A 7 mm polyp was found in the sigmoid colon. The polyp was sessile. The       polyp was removed with a cold snare. Resection and retrieval were       complete.      Multiple diverticula were found in the sigmoid colon.      The exam was otherwise without abnormality.      The rectum, sigmoid colon, descending colon, transverse colon, ascending       colon and cecum appeared normal.      The retroflexed view of the distal rectum and anal verge was normal and       showed no anal or rectal abnormalities. Impression:           - Six 3 to 4 mm polyps in the sigmoid colon, in the                        transverse colon and in the cecum, removed with a cold                        biopsy forceps. Resected and retrieved.                       - One 7 mm polyp in the sigmoid colon, removed with a                        cold snare. Resected  and retrieved.                       - Diverticulosis in the sigmoid colon.                       - The examination was otherwise normal.                       - The rectum, sigmoid colon, descending colon,                        transverse colon, ascending colon and cecum are normal.                       - The distal rectum and anal verge are normal on                         retroflexion view. Recommendation:       - Discharge patient to home (with escort).                       - High fiber diet.                       - Advance diet as tolerated.                       - Continue present medications.                       - Await pathology results.                       - Repeat colonoscopy date to be determined after                        pending pathology results are reviewed.                       - The findings and recommendations were discussed with                        the patient.                       - The findings and recommendations were discussed with                        the patient's family.                       - Return to primary care physician as previously                        scheduled. Procedure Code(s):    --- Professional ---                       [phone], Colonoscopy, flexible; with removal of tumor(s),                        polyp(s), or other lesion(s) by snare technique  28638, 71, Colonoscopy, flexible; with biopsy, single                        or multiple Diagnosis Code(s):    --- Professional ---                       K63.5, Polyp of colon                       Z12.11, Encounter for screening for malignant neoplasm                        of colon                       K57.30, Diverticulosis of large intestine without                        perforation or abscess without bleeding CPT copyright 2019 American Medical Association. All rights reserved. The codes documented in this report are preliminary and upon coder review may  be revised to meet current compliance requirements.  Vonda Antigua, MD Margretta Sidle B. Bonna Gains MD, MD 02/11/2019 10:57:24 AM This report has been signed electronically. Number of Addenda: 0 Note Initiated On: 02/11/2019 9:43 AM Scope Withdrawal Time: 0 hours 40 minutes 33 seconds  Total Procedure Duration: 0 hours 50 minutes 36 seconds  Estimated Blood Loss: Estimated blood  loss: none.      [facility]

## 2019-02-12 ENCOUNTER — Encounter: Payer: Self-pay | Admitting: Gastroenterology

## 2019-02-12 LAB — SURGICAL PATHOLOGY

## 2019-02-12 NOTE — Anesthesia Postprocedure Evaluation (Signed)
Anesthesia Post Note  Patient: Peter Nguyen  Procedure(s) Performed: COLONOSCOPY WITH PROPOFOL (N/A )  Patient location during evaluation: Endoscopy Anesthesia Type: General Level of consciousness: awake and alert and oriented Pain management: pain level controlled Vital Signs Assessment: post-procedure vital signs reviewed and stable Respiratory status: spontaneous breathing Cardiovascular status: blood pressure returned to baseline Anesthetic complications: no     Last Vitals:  Vitals:   02/11/19 1110 02/11/19 1120  BP: 110/71 128/80  Pulse: 77 76  Resp: 15   Temp:    SpO2: 98% 94%    Last Pain:  Vitals:   02/11/19 1050  TempSrc: Tympanic  PainSc:                  [name]

## 2019-02-17 ENCOUNTER — Encounter: Payer: Self-pay | Admitting: Gastroenterology

## 2019-03-16 ENCOUNTER — Ambulatory Visit: Payer: Self-pay | Admitting: Family Medicine

## 2019-03-22 ENCOUNTER — Ambulatory Visit (INDEPENDENT_AMBULATORY_CARE_PROVIDER_SITE_OTHER): Payer: BC Managed Care – PPO | Admitting: Family Medicine

## 2019-03-22 DIAGNOSIS — Z23 Encounter for immunization: Secondary | ICD-10-CM

## 2019-03-22 NOTE — Progress Notes (Signed)
Patient here for Shingrix vaccination only.  I did not examine the patient.  I did review his medical history, medications, and allergies and vaccine consent form.  CMA gave vaccination. Patient tolerated well.  Virginia Crews, MD, MPH [facility] 03/22/2019 9:50 AM

## 2019-07-12 ENCOUNTER — Encounter: Payer: BC Managed Care – PPO | Admitting: Family Medicine

## 2019-08-24 ENCOUNTER — Encounter: Payer: BC Managed Care – PPO | Admitting: Family Medicine

## 2019-12-08 ENCOUNTER — Other Ambulatory Visit: Payer: Self-pay | Admitting: Orthopedic Surgery

## 2019-12-08 DIAGNOSIS — S46212A Strain of muscle, fascia and tendon of other parts of biceps, left arm, initial encounter: Secondary | ICD-10-CM

## 2020-01-07 ENCOUNTER — Other Ambulatory Visit: Payer: Self-pay

## 2020-01-07 ENCOUNTER — Other Ambulatory Visit: Payer: BC Managed Care – PPO

## 2020-01-07 ENCOUNTER — Ambulatory Visit
Admission: RE | Admit: 2020-01-07 | Discharge: 2020-01-07 | Disposition: A | Discharge status: Home / Self Care | Payer: BC Managed Care – PPO | Source: Ambulatory Visit | Attending: Orthopedic Surgery | Admitting: Orthopedic Surgery

## 2020-01-07 DIAGNOSIS — S46212A Strain of muscle, fascia and tendon of other parts of biceps, left arm, initial encounter: Secondary | ICD-10-CM

## 2021-01-07 IMAGING — MR MR ELBOW*L* W/O CM
4 of 7 series · 23 of 40 positions shown · non-contrast
Comparison: MRI left elbow 07/14/2010. Plain films left elbow
10/31/2014.

CLINICAL DATA: History of repair for a tear of the biceps tendon
from the radius in 7907. New onset pain. Question biceps tendon
re-tear.

EXAM:
MRI OF THE LEFT ELBOW WITHOUT CONTRAST
TECHNIQUE: Multiplanar, multisequence MR imaging of the elbow was performed. No
intravenous contrast was administered.

[Series 11: PD fat-sat · sagittal · 4.0mm · 0.27mm/px · 4 of 18 slices shown (1 of 2)]
[im 1/18]
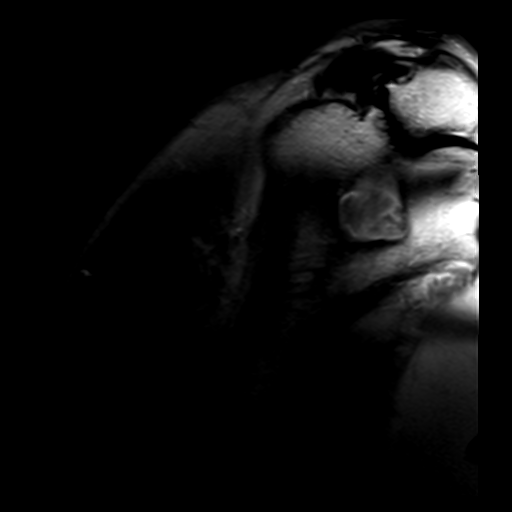
[im 6/18]
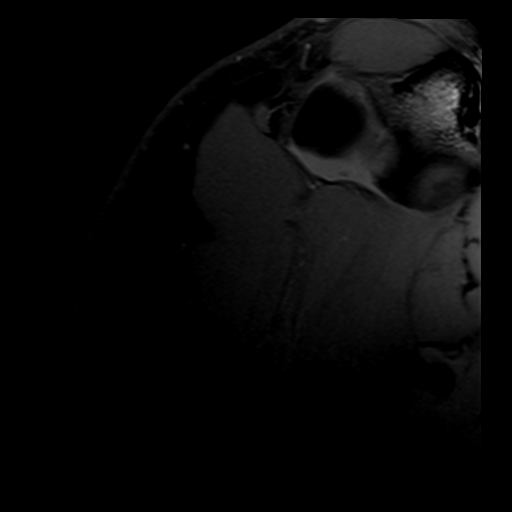
[im 12/18]
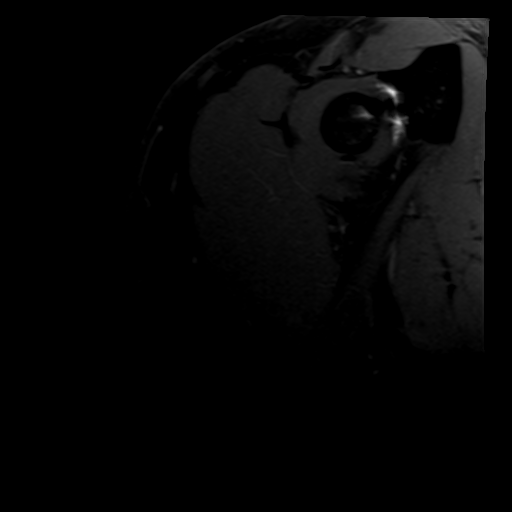
[im 18/18]
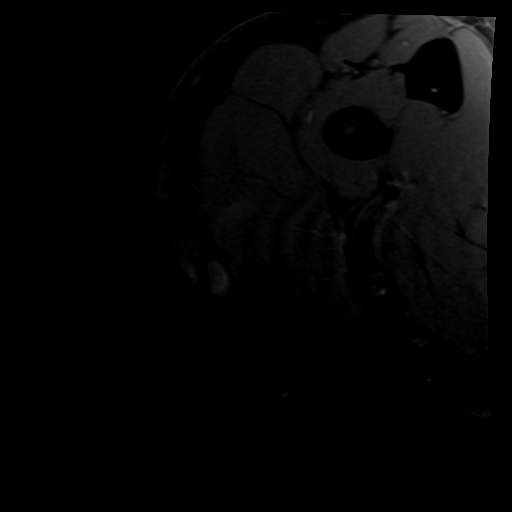

[Series 17: T2 fat-sat · axial · 3.0mm · 0.29mm/px · z∈[-46,+86]mm · 8 of 35 slices shown (1 of 2)]
[im 1/35]
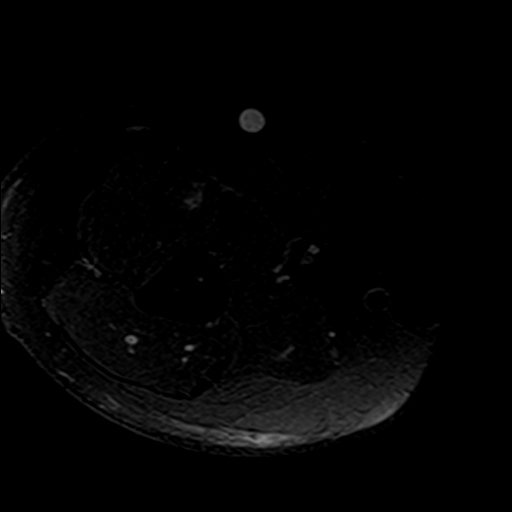
[im 5/35]
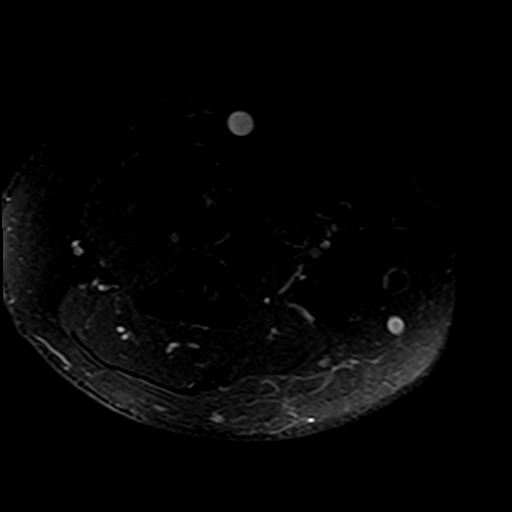
[im 10/35]
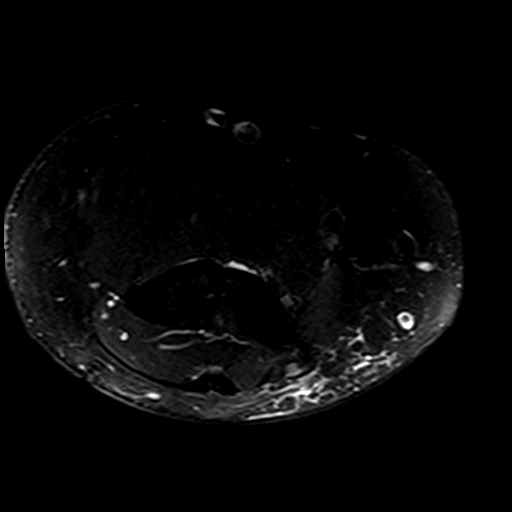
[im 15/35]
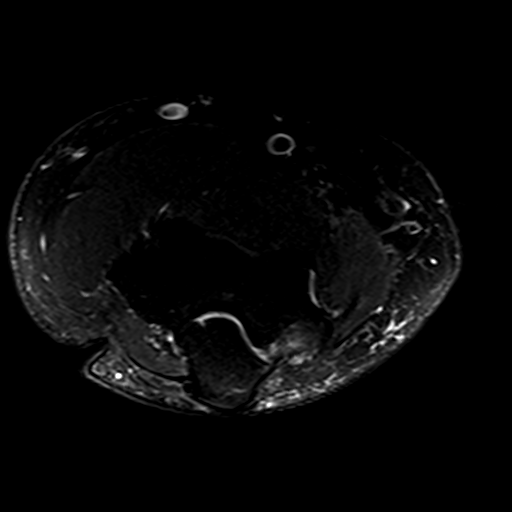
[im 20/35]
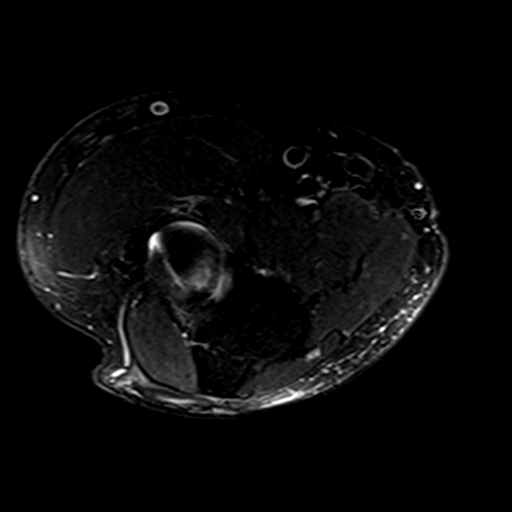
[im 25/35]
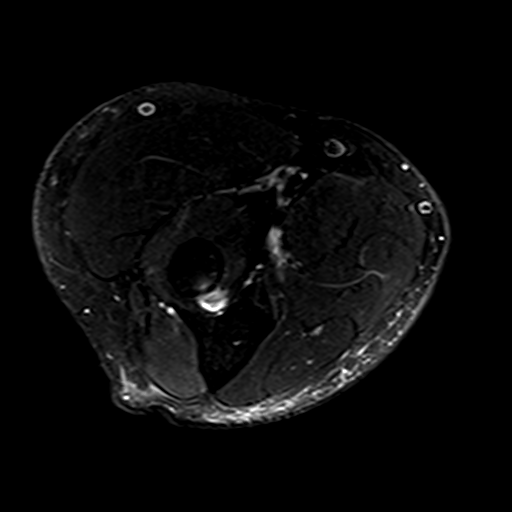
[im 30/35]
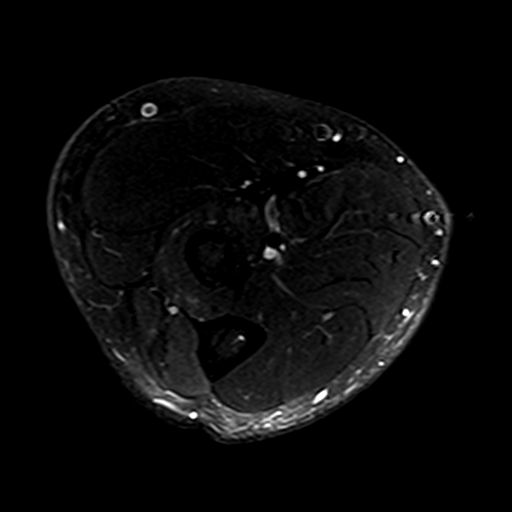
[im 35/35]
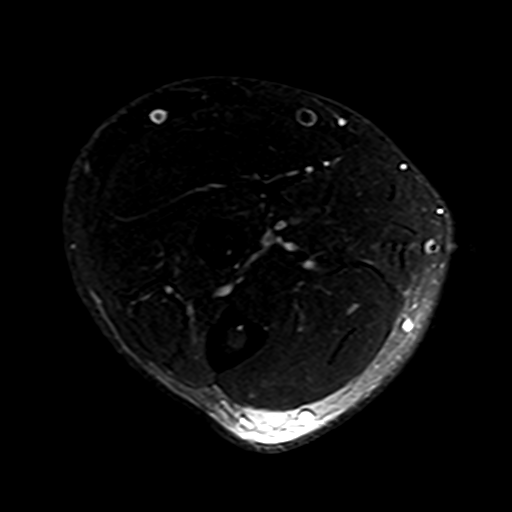

[Series 20: T2 fat-sat · oblique · 3.0mm · 0.29mm/px · 5 of 24 slices shown (2 of 2)]
[im 1/24]
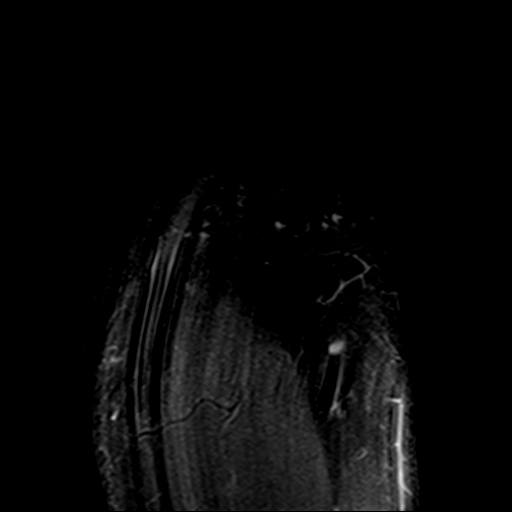
[im 6/24]
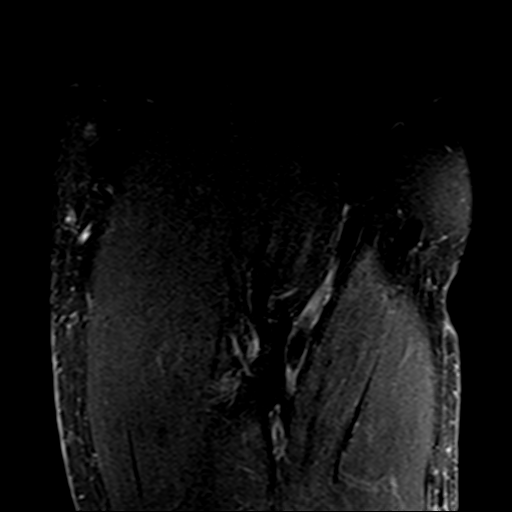
[im 12/24]
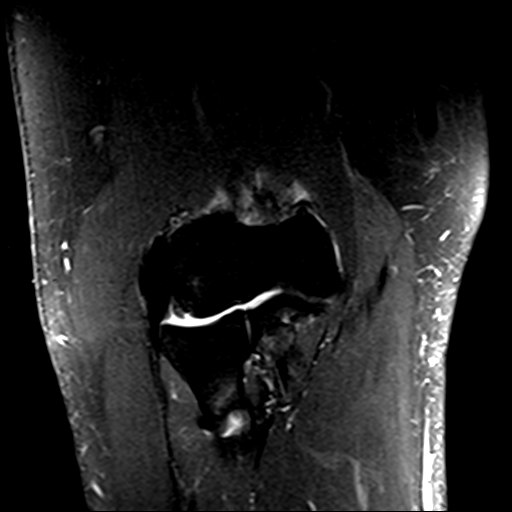
[im 18/24]
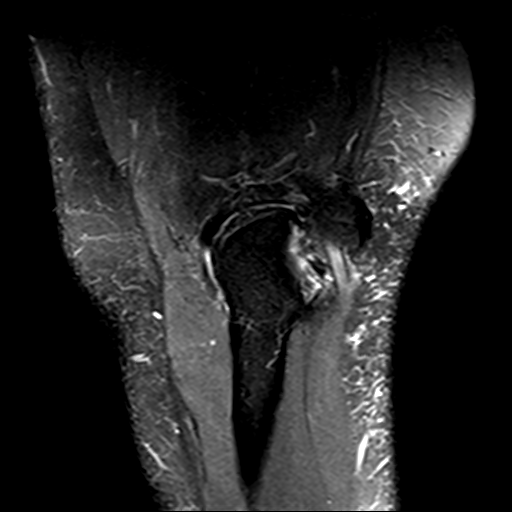
[im 24/24]
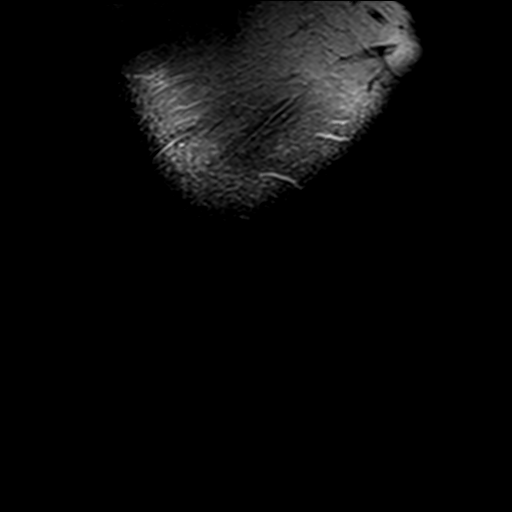

[Series 23: PD fat-sat · oblique · 3.0mm · 0.59mm/px · 6 of 26 slices shown (2 of 2)]
[im 1/26]
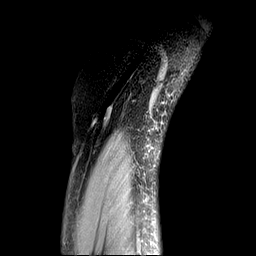
[im 6/26]
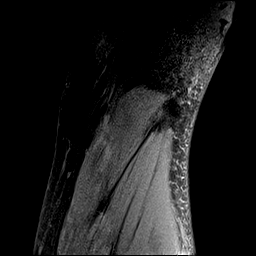
[im 11/26]
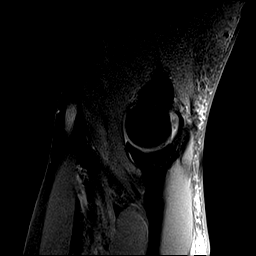
[im 16/26]
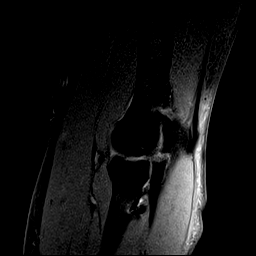
[im 21/26]
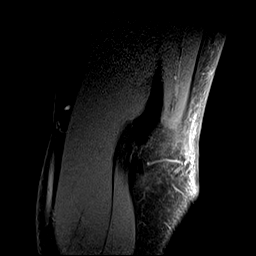
[im 26/26]
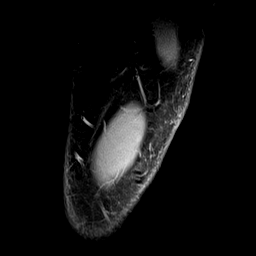

[23 of 40 positions shown; findings below may reference images not displayed]

FINDINGS: TENDONS

Common forearm flexor origin: Intact.

Common forearm extensor origin: Intact. There is minimally increased
T2 signal in the common extensor origin.

Biceps: The patient is status post biceps tendon repair. The tendon
is intact and with normal signal.

Triceps: Intact.

LIGAMENTS

Medial stabilizers: Intact.

Lateral stabilizers:  Intact.

Cartilage: Minimally degenerated.

Joint: No effusion.

Cubital tunnel: Normal.

Bones: Mild osteophytosis is identified. No fracture, contusion or
worrisome lesion.
IMPRESSION: Status post biceps tendon repair. The tendon is intact without
evidence of tendinosis.

Mild medial epicondylitis.  Negative for tendon or ligament tear.

Mild osteoarthritis about the elbow.

## 2022-01-03 ENCOUNTER — Ambulatory Visit
Admission: EM | Admit: 2022-01-03 | Discharge: 2022-01-03 | Disposition: A | Discharge status: Home / Self Care | Payer: BC Managed Care – PPO | Attending: Family Medicine | Admitting: Family Medicine

## 2022-01-03 DIAGNOSIS — R300 Dysuria: Secondary | ICD-10-CM | POA: Diagnosis present

## 2022-01-03 LAB — POCT URINALYSIS DIP (MANUAL ENTRY)
Bilirubin, UA: NEGATIVE
Glucose, UA: NEGATIVE mg/dL
Nitrite, UA: NEGATIVE
Protein Ur, POC: NEGATIVE mg/dL
Spec Grav, UA: 1.02 (ref 1.010–1.025)
Urobilinogen, UA: 0.2 E.U./dL
pH, UA: 6.5 (ref 5.0–8.0)

## 2022-01-03 NOTE — ED Triage Notes (Signed)
Pt presents with dysuria for a couple of days. He thinks he may have passed a few kidney stones last week. He noticed some blood in his urine today.

## 2022-01-03 NOTE — Discharge Instructions (Signed)
Urine culture is pending and will result within 3-4 days. If any additional treatment is warranted, our office will contact you by phone.  In the meantime if you see any gross blood or have any severe abdominal or low back pain go immediately to the emergency department as this could be a sign of obstructing renal stone.

## 2022-01-03 NOTE — ED Provider Notes (Signed)
Roderic Palau    CSN: [id number] Arrival date & time: 01/03/22  1709      History   Chief Complaint Chief Complaint  Patient presents with   Dysuria    HPI Peter Nguyen is a 61 y.o. male.   HPI Patient presents today for evaluation of possible urinary tract infection. Patient reports noticing a "string" of blood in his urine. He reports that he's passed multiple renal stones over the last week and was concern that due to the pain with urination, he may have a UTI. He endorses fatigue,however  no fever, nausea, vomiting or flank pain. He endorses low back pain, which he reports he always develops with passing renal stones. Past Medical History:  Diagnosis Date   Collar bone fracture    History of kidney stones    Obesity    Seasonal allergies     Patient Active Problem List   Diagnosis Date Noted   Encounter for screening colonoscopy    Polyp of colon    Diverticulitis of colon with hemorrhage    Morbid obesity ([place]) 01/04/2019    Past Surgical History:  Procedure Laterality Date   BICEPS TENDON REPAIR  2003   Left   COLONOSCOPY WITH PROPOFOL N/A 02/11/2019   Procedure: COLONOSCOPY WITH PROPOFOL;  Surgeon: Virgel Manifold, MD;  Location: [facility] ENDOSCOPY;  Service: Gastroenterology;  Laterality: N/A;   KIDNEY SURGERY  1989   Cyst removed -Right    KNEE ARTHROSCOPY  2003   Right x4   KNEE ARTHROSCOPY  1982   Right knee   NECK SURGERY     C4-6        Home Medications    Prior to Admission medications   Medication Sig Start Date End Date Taking? Authorizing Provider  Multiple Vitamin (MULTI-VITAMIN) tablet Take by mouth.    [provider]    Family History Family History  Problem Relation Age of Onset   Anesthesia problems Mother    Hypotension Mother    Cancer Mother        breast, lung, lymphoma   Multiple sclerosis Brother    Anxiety disorder Daughter    Mesothelioma Maternal Grandfather        asbestos exposure    Emphysema Paternal Grandfather     Social History Social History   Tobacco Use   Smoking status: Former    Packs/day: 0.20    Years: 1.00    Total pack years: 0.20    Types: Cigarettes   Smokeless tobacco: Former    Quit date: 05/08/1986  Vaping Use   Vaping Use: Never used  Substance Use Topics   Alcohol use: Yes    Comment: rarely   Drug use: No     Allergies   Ciprofloxacin and Prednisone   Review of Systems Review of Systems Pertinent negatives listed in HPI   Physical Exam Triage Vital Signs ED Triage Vitals [01/03/22 1803]  Enc Vitals Group     BP 130/84     Pulse Rate 92     Resp 18     Temp 98.9 F (37.2 C)     Temp src      SpO2 95 %     Weight      Height      Head Circumference      Peak Flow      Pain Score 0     Pain Loc      Pain Edu?  Excl. in [place]?    No data found.  Updated Vital Signs BP 130/84 (BP Location: Right Arm)   Pulse 92   Temp 98.9 F (37.2 C)   Resp 18   SpO2 95%   Visual Acuity Right Eye Distance:   Left Eye Distance:   Bilateral Distance:    Right Eye Near:   Left Eye Near:    Bilateral Near:     Physical Exam General appearance:Alert, obese,  cooperative and no distress Head: Normocephalic, without obvious abnormality, atraumatic Heart: Rate and rhythm normal.   Respiratory: Respirations even and unlabored, normal respiratory rate CVA:  no flank pain Extremities: No gross deformities Skin: Skin color, texture, turgor normal. No rashes seen  Psych: Appropriate mood and affect.   UC Treatments / Results  Labs (all labs ordered are listed, but only abnormal results are displayed) Labs Reviewed  POCT URINALYSIS DIP (MANUAL ENTRY) - Abnormal; Notable for the following components:      Result Value   Clarity, UA cloudy (*)    Ketones, POC UA trace (5) (*)    Blood, UA small (*)    Leukocytes, UA Trace (*)    All other components within normal limits  URINE CULTURE    EKG   Radiology No results  found.  Procedures Procedures (including critical care time)  Medications Ordered in UC Medications - No data to display  Initial Impression / Assessment and Plan / UC Course  I have reviewed the triage vital signs and the nursing notes.  Pertinent labs & imaging results that were available during my care of the patient were reviewed by me and considered in my medical decision making (see chart for details).    Dysuria, UA is insignificant for that of a urinary tract infection however will culture urine to rule out any type of infectious process.  Patient does have a moderate amount of blood in his urine therefore advised him that there is likely some kidney stones however he reports that he frequently passes them and he does not have a urologist.  Offered Pyridium and patient declined.  Advised that once urine culture results if any additional treatment is warranted we will notify him by phone.  Did discuss red flags which warrant immediate evaluation in the setting of the ER patient verbalized understanding and agreement with plan. Final Clinical Impressions(s) / UC Diagnoses   Final diagnoses:  Dysuria     Discharge Instructions      Urine culture is pending and will result within 3-4 days. If any additional treatment is warranted, our office will contact you by phone.  In the meantime if you see any gross blood or have any severe abdominal or low back pain go immediately to the emergency department as this could be a sign of obstructing renal stone.   ED Prescriptions   None    PDMP not reviewed this encounter.   Scot Jun, FNP 01/03/22 1911

## 2022-01-05 LAB — URINE CULTURE
Culture: 40000 — AB
Special Requests: NORMAL

## 2022-01-07 ENCOUNTER — Telehealth (HOSPITAL_COMMUNITY): Payer: Self-pay | Admitting: Emergency Medicine

## 2022-01-07 MED ORDER — AMOXICILLIN-POT CLAVULANATE 875-125 MG PO TABS: 1.0000 | ORAL_TABLET | Freq: Two times a day (BID) | ORAL | 0 refills | Status: AC

## 2022-01-07 MED ORDER — AMOXICILLIN-POT CLAVULANATE 875-125 MG PO TABS: 1.0000 | ORAL_TABLET | Freq: Two times a day (BID) | ORAL | 0 refills | Status: DC

## 2022-07-08 DIAGNOSIS — R030 Elevated blood-pressure reading, without diagnosis of hypertension: Secondary | ICD-10-CM

## 2022-07-08 HISTORY — DX: Elevated blood-pressure reading, without diagnosis of hypertension: R03.0

## 2023-05-27 ENCOUNTER — Other Ambulatory Visit: Payer: Self-pay | Admitting: Surgery

## 2023-05-28 ENCOUNTER — Other Ambulatory Visit: Payer: Self-pay

## 2023-05-28 ENCOUNTER — Encounter
Admission: RE | Admit: 2023-05-28 | Discharge: 2023-05-28 | Disposition: A | Discharge status: Home / Self Care | Payer: BC Managed Care – PPO | Source: Ambulatory Visit | Attending: Surgery | Admitting: Surgery

## 2023-05-28 HISTORY — DX: Family history of other specified conditions: Z84.89

## 2023-05-28 HISTORY — DX: Pneumonia, unspecified organism: J18.9

## 2023-05-28 HISTORY — DX: Prediabetes: R73.03

## 2023-05-28 HISTORY — DX: Other specified postprocedural states: Z98.890

## 2023-05-28 HISTORY — DX: Unspecified osteoarthritis, unspecified site: M19.90

## 2023-05-28 NOTE — Patient Instructions (Addendum)
Your procedure is scheduled on: 06/03/23 - Tuesday Report to the Registration Desk on the 1st floor of the [facility]. To find out your arrival time, please call [phone] between 1PM - 3PM on: 06/02/23 - Monday If your arrival time is 6:00 am, do not arrive before that time as the [facility] entrance doors do not open until 6:00 am.  REMEMBER: Instructions that are not followed completely may result in serious medical risk, up to and including death; or upon the discretion of your surgeon and anesthesiologist your surgery may need to be rescheduled.  Do not eat food after midnight the night before surgery.  No gum chewing or hard candies.  You may however, drink CLEAR liquids up to 2 hours before you are scheduled to arrive for your surgery. Do not drink anything within 2 hours of your scheduled arrival time.  Clear liquids include: - water  - apple juice without pulp - gatorade (not RED colors) - black coffee or tea (Do NOT add milk or creamers to the coffee or tea) Do NOT drink anything that is not on this list.  In addition, your doctor has ordered for you to drink the provided:  Ensure Pre-Surgery Clear Carbohydrate Drink  Drinking this carbohydrate drink up to two hours before surgery helps to reduce insulin resistance and improve patient outcomes. Please complete drinking 2 hours before scheduled arrival time.  One week prior to surgery: Stop Anti-inflammatories (NSAIDS) such as Advil, Aleve, Ibuprofen, Motrin, Naproxen, Naprosyn and Aspirin based products such as Excedrin, Goody's Powder, BC Powder. You may take Tylenol if needed for pain up until the day of surgery.  Stop ANY OVER THE COUNTER supplements until after surgery.   ON THE DAY OF SURGERY ONLY TAKE THESE MEDICATIONS WITH SIPS OF WATER:  none   No Alcohol for 24 hours before or after surgery.  No Smoking including e-cigarettes for 24 hours before surgery.  No chewable tobacco products for at least 6  hours before surgery.  No nicotine patches on the day of surgery.  Do not use any "recreational" drugs for at least a week (preferably 2 weeks) before your surgery.  Please be advised that the combination of cocaine and anesthesia may have negative outcomes, up to and including death. If you test positive for cocaine, your surgery will be cancelled.  On the morning of surgery brush your teeth with toothpaste and water, you may rinse your mouth with mouthwash if you wish. Do not swallow any toothpaste or mouthwash.  Use CHG Soap or wipes as directed on instruction sheet.  Do not wear jewelry, make-up, hairpins, clips or nail polish.  For welded (permanent) jewelry: bracelets, anklets, waist bands, etc.  Please have this removed prior to surgery.  If it is not removed, there is a chance that hospital personnel will need to cut it off on the day of surgery.  Do not wear lotions, powders, or perfumes.   Do not shave body hair from the neck down 48 hours before surgery.  Contact lenses, hearing aids and dentures may not be worn into surgery.  Do not bring valuables to the hospital. [facility] is not responsible for any missing/lost belongings or valuables.   Notify your doctor if there is any change in your medical condition (cold, fever, infection).  Wear comfortable clothing (specific to your surgery type) to the hospital.  After surgery, you can help prevent lung complications by doing breathing exercises.  Take deep breaths and cough every 1-2  hours. Your doctor may order a device called an Incentive Spirometer to help you take deep breaths. When coughing or sneezing, hold a pillow firmly against your incision with both hands. This is called "splinting." Doing this helps protect your incision. It also decreases belly discomfort.  If you are being admitted to the hospital overnight, leave your suitcase in the car. After surgery it may be brought to your room.  In case of increased  patient census, it may be necessary for you, the patient, to continue your postoperative care in the Same Day Surgery department.  If you are being discharged the day of surgery, you will not be allowed to drive home. You will need a responsible individual to drive you home and stay with you for 24 hours after surgery.   If you are taking public transportation, you will need to have a responsible individual with you.  Please call the Pre-admissions Testing Dept. at [phone] if you have any questions about these instructions.  Surgery Visitation Policy:  Patients having surgery or a procedure may have two visitors.  Children under the age of 55 must have an adult with them who is not the patient.  Inpatient Visitation:    Visiting hours are 7 a.m. to 8 p.m. Up to four visitors are allowed at one time in a patient room. The visitors may rotate out with other people during the day.  One visitor age 32 or older may stay with the patient overnight and must be in the room by 8 p.m.    Preparing for Surgery with CHLORHEXIDINE GLUCONATE (CHG) Soap  Chlorhexidine Gluconate (CHG) Soap  o An antiseptic cleaner that kills germs and bonds with the skin to continue killing germs even after washing  o Used for showering the night before surgery and morning of surgery  Before surgery, you can play an important role by reducing the number of germs on your skin.  CHG (Chlorhexidine gluconate) soap is an antiseptic cleanser which kills germs and bonds with the skin to continue killing germs even after washing.  Please do not use if you have an allergy to CHG or antibacterial soaps. If your skin becomes reddened/irritated stop using the CHG.  1. Shower the NIGHT BEFORE SURGERY and the MORNING OF SURGERY with CHG soap.  2. If you choose to wash your hair, wash your hair first as usual with your normal shampoo.  3. After shampooing, rinse your hair and body thoroughly to remove the  shampoo.  4. Use CHG as you would any other liquid soap. You can apply CHG directly to the skin and wash gently with a scrungie or a clean washcloth.  5. Apply the CHG soap to your body only from the neck down. Do not use on open wounds or open sores. Avoid contact with your eyes, ears, mouth, and genitals (private parts). Wash face and genitals (private parts) with your normal soap.  6. Wash thoroughly, paying special attention to the area where your surgery will be performed.  7. Thoroughly rinse your body with warm water.  8. Do not shower/wash with your normal soap after using and rinsing off the CHG soap.  9. Pat yourself dry with a clean towel.  10. Wear clean pajamas to bed the night before surgery.  12. Place clean sheets on your bed the night of your first shower and do not sleep with pets.  13. Shower again with the CHG soap on the day of surgery prior to arriving at  the hospital.  14. Do not apply any deodorants/lotions/powders.  15. Please wear clean clothes to the hospital.

## 2023-05-29 ENCOUNTER — Encounter
Admission: RE | Admit: 2023-05-29 | Discharge: 2023-05-29 | Disposition: A | Discharge status: Home / Self Care | Payer: BC Managed Care – PPO | Source: Ambulatory Visit | Attending: Surgery | Admitting: Surgery

## 2023-05-29 DIAGNOSIS — Z01810 Encounter for preprocedural cardiovascular examination: Secondary | ICD-10-CM | POA: Insufficient documentation

## 2023-06-03 ENCOUNTER — Other Ambulatory Visit: Payer: Self-pay

## 2023-06-03 ENCOUNTER — Encounter
Admission: RE | Disposition: A | Discharge status: Home / Self Care | Payer: Self-pay | Source: Ambulatory Visit | Attending: Surgery

## 2023-06-03 ENCOUNTER — Ambulatory Visit: Payer: BC Managed Care – PPO | Admitting: Certified Registered"

## 2023-06-03 ENCOUNTER — Ambulatory Visit
Admission: RE | Admit: 2023-06-03 | Discharge: 2023-06-03 | Disposition: A | Discharge status: Home / Self Care | Payer: BC Managed Care – PPO | Source: Ambulatory Visit | Attending: Surgery | Admitting: Surgery

## 2023-06-03 ENCOUNTER — Encounter: Payer: Self-pay | Admitting: Surgery

## 2023-06-03 DIAGNOSIS — E66813 Obesity, class 3: Secondary | ICD-10-CM | POA: Insufficient documentation

## 2023-06-03 DIAGNOSIS — Z87891 Personal history of nicotine dependence: Secondary | ICD-10-CM | POA: Insufficient documentation

## 2023-06-03 DIAGNOSIS — Z6841 Body Mass Index (BMI) 40.0 and over, adult: Secondary | ICD-10-CM | POA: Insufficient documentation

## 2023-06-03 DIAGNOSIS — D1722 Benign lipomatous neoplasm of skin and subcutaneous tissue of left arm: Secondary | ICD-10-CM | POA: Diagnosis present

## 2023-06-03 HISTORY — PX: MASS EXCISION: SHX2000

## 2023-06-03 SURGERY — EXCISION MASS
Anesthesia: General | Site: Elbow | Laterality: Left

## 2023-06-03 MED ORDER — ROCURONIUM BROMIDE 100 MG/10ML IV SOLN
INTRAVENOUS | Status: DC | PRN
  Administered 2023-06-03: 40 mg via INTRAVENOUS

## 2023-06-03 MED ORDER — CHLORHEXIDINE GLUCONATE 0.12 % MT SOLN
15.0000 mL | Freq: Once | OROMUCOSAL | Status: AC
  Administered 2023-06-03: 15 mL via OROMUCOSAL

## 2023-06-03 MED ORDER — METOCLOPRAMIDE HCL 5 MG/ML IJ SOLN: 5.0000 mg | Freq: Three times a day (TID) | INTRAMUSCULAR | Status: DC | PRN

## 2023-06-03 MED ORDER — LACTATED RINGERS IV SOLN: INTRAVENOUS | Status: DC

## 2023-06-03 MED ORDER — PROPOFOL 500 MG/50ML IV EMUL
INTRAVENOUS | Status: DC | PRN
  Administered 2023-06-03: 165 ug/kg/min via INTRAVENOUS

## 2023-06-03 MED ORDER — MIDAZOLAM HCL 2 MG/2ML IJ SOLN
INTRAMUSCULAR | Status: AC
  Filled 2023-06-03: qty 2

## 2023-06-03 MED ORDER — FENTANYL CITRATE (PF) 100 MCG/2ML IJ SOLN: 25.0000 ug | INTRAMUSCULAR | Status: DC | PRN

## 2023-06-03 MED ORDER — PROPOFOL 1000 MG/100ML IV EMUL
INTRAVENOUS | Status: AC
  Filled 2023-06-03: qty 100

## 2023-06-03 MED ORDER — FENTANYL CITRATE (PF) 100 MCG/2ML IJ SOLN
INTRAMUSCULAR | Status: AC
  Filled 2023-06-03: qty 2

## 2023-06-03 MED ORDER — FENTANYL CITRATE (PF) 100 MCG/2ML IJ SOLN
INTRAMUSCULAR | Status: DC | PRN
  Administered 2023-06-03: 50 ug via INTRAVENOUS

## 2023-06-03 MED ORDER — KETAMINE HCL 50 MG/5ML IJ SOSY
PREFILLED_SYRINGE | INTRAMUSCULAR | Status: AC
  Filled 2023-06-03: qty 5

## 2023-06-03 MED ORDER — ONDANSETRON HCL 4 MG/2ML IJ SOLN
INTRAMUSCULAR | Status: DC | PRN
  Administered 2023-06-03 (×2): 4 mg via INTRAVENOUS

## 2023-06-03 MED ORDER — ACETAMINOPHEN 325 MG PO TABS: 325.0000 mg | ORAL_TABLET | Freq: Four times a day (QID) | ORAL | Status: DC | PRN

## 2023-06-03 MED ORDER — SUCCINYLCHOLINE CHLORIDE 200 MG/10ML IV SOSY
PREFILLED_SYRINGE | INTRAVENOUS | Status: DC | PRN
  Administered 2023-06-03: 160 mg via INTRAVENOUS

## 2023-06-03 MED ORDER — SUGAMMADEX SODIUM 200 MG/2ML IV SOLN
INTRAVENOUS | Status: DC | PRN
  Administered 2023-06-03: 200 mg via INTRAVENOUS

## 2023-06-03 MED ORDER — GLYCOPYRROLATE 0.2 MG/ML IJ SOLN
INTRAMUSCULAR | Status: DC | PRN
  Administered 2023-06-03: .2 mg via INTRAVENOUS

## 2023-06-03 MED ORDER — ACETAMINOPHEN 10 MG/ML IV SOLN
INTRAVENOUS | Status: DC | PRN
  Administered 2023-06-03: 1000 mg via INTRAVENOUS

## 2023-06-03 MED ORDER — 0.9 % SODIUM CHLORIDE (POUR BTL) OPTIME
TOPICAL | Status: DC | PRN
  Administered 2023-06-03: 500 mL

## 2023-06-03 MED ORDER — ORAL CARE MOUTH RINSE: 15.0000 mL | Freq: Once | OROMUCOSAL | Status: AC

## 2023-06-03 MED ORDER — MIDAZOLAM HCL 2 MG/2ML IJ SOLN
INTRAMUSCULAR | Status: DC | PRN
  Administered 2023-06-03: 2 mg via INTRAVENOUS

## 2023-06-03 MED ORDER — METOCLOPRAMIDE HCL 10 MG PO TABS: 5.0000 mg | ORAL_TABLET | Freq: Three times a day (TID) | ORAL | Status: DC | PRN

## 2023-06-03 MED ORDER — OXYCODONE HCL 5 MG/5ML PO SOLN: 5.0000 mg | Freq: Once | ORAL | Status: DC | PRN

## 2023-06-03 MED ORDER — ONDANSETRON HCL 4 MG PO TABS: 4.0000 mg | ORAL_TABLET | Freq: Four times a day (QID) | ORAL | Status: DC | PRN

## 2023-06-03 MED ORDER — DEXAMETHASONE SODIUM PHOSPHATE 10 MG/ML IJ SOLN
INTRAMUSCULAR | Status: DC | PRN
  Administered 2023-06-03: 10 mg via INTRAVENOUS

## 2023-06-03 MED ORDER — DEXTROSE-SODIUM CHLORIDE 5-0.9 % IV SOLN: INTRAVENOUS | Status: DC

## 2023-06-03 MED ORDER — OXYCODONE HCL 5 MG PO TABS: 5.0000 mg | ORAL_TABLET | Freq: Once | ORAL | Status: DC | PRN

## 2023-06-03 MED ORDER — ACETAMINOPHEN 10 MG/ML IV SOLN
INTRAVENOUS | Status: AC
  Filled 2023-06-03: qty 100

## 2023-06-03 MED ORDER — CHLORHEXIDINE GLUCONATE 0.12 % MT SOLN
OROMUCOSAL | Status: AC
  Filled 2023-06-03: qty 15

## 2023-06-03 MED ORDER — BUPIVACAINE HCL (PF) 0.5 % IJ SOLN
INTRAMUSCULAR | Status: AC
  Filled 2023-06-03: qty 30

## 2023-06-03 MED ORDER — LIDOCAINE HCL (CARDIAC) PF 100 MG/5ML IV SOSY
PREFILLED_SYRINGE | INTRAVENOUS | Status: DC | PRN
  Administered 2023-06-03: 100 mg via INTRAVENOUS

## 2023-06-03 MED ORDER — BUPIVACAINE HCL (PF) 0.5 % IJ SOLN
INTRAMUSCULAR | Status: DC | PRN
  Administered 2023-06-03: 10 mL

## 2023-06-03 MED ORDER — CEFAZOLIN IN SODIUM CHLORIDE 3-0.9 GM/100ML-% IV SOLN
3.0000 g | INTRAVENOUS | Status: AC
  Administered 2023-06-03: 3 g via INTRAVENOUS
  Filled 2023-06-03: qty 100

## 2023-06-03 MED ORDER — ONDANSETRON HCL 4 MG/2ML IJ SOLN: 4.0000 mg | Freq: Four times a day (QID) | INTRAMUSCULAR | Status: DC | PRN

## 2023-06-03 MED ORDER — DEXMEDETOMIDINE HCL IN NACL 200 MCG/50ML IV SOLN
INTRAVENOUS | Status: DC | PRN
  Administered 2023-06-03: 12 ug via INTRAVENOUS

## 2023-06-03 MED ORDER — PROPOFOL 10 MG/ML IV BOLUS
INTRAVENOUS | Status: DC | PRN
  Administered 2023-06-03: 200 mg via INTRAVENOUS

## 2023-06-03 SURGICAL SUPPLY — 27 items
BENZOIN TINCTURE PRP APPL 2/3 (GAUZE/BANDAGES/DRESSINGS) IMPLANT
BNDG ELASTIC 2X5.8 VLCR NS LF (GAUZE/BANDAGES/DRESSINGS) IMPLANT
BNDG ELASTIC 3X5.8 VLCR NS LF (GAUZE/BANDAGES/DRESSINGS) IMPLANT
BNDG ESMARCH 4X12 STRL LF (GAUZE/BANDAGES/DRESSINGS) ×1 IMPLANT
CHLORAPREP W/TINT 26 (MISCELLANEOUS) ×1 IMPLANT
CORD BIP STRL DISP 12FT (MISCELLANEOUS) ×1 IMPLANT
CUFF TOURN SGL QUICK 18X4 (TOURNIQUET CUFF) IMPLANT
CUFF TRNQT CYL 24X4X16.5-23 (TOURNIQUET CUFF) IMPLANT
GAUZE SPONGE 4X4 12PLY STRL (GAUZE/BANDAGES/DRESSINGS) ×1 IMPLANT
GAUZE XEROFORM 1X8 LF (GAUZE/BANDAGES/DRESSINGS) ×1 IMPLANT
GLOVE BIO SURGEON STRL SZ8 (GLOVE) ×2 IMPLANT
GLOVE INDICATOR 8.0 STRL GRN (GLOVE) ×1 IMPLANT
GOWN STRL REUS W/ TWL LRG LVL3 (GOWN DISPOSABLE) ×1 IMPLANT
GOWN STRL REUS W/ TWL XL LVL3 (GOWN DISPOSABLE) ×1 IMPLANT
KIT TURNOVER KIT A (KITS) ×1 IMPLANT
MANIFOLD NEPTUNE II (INSTRUMENTS) ×1 IMPLANT
NS IRRIG 500ML POUR BTL (IV SOLUTION) ×1 IMPLANT
PACK EXTREMITY ARMC (MISCELLANEOUS) ×1 IMPLANT
SOL PREP PVP 2OZ (MISCELLANEOUS)
SOLUTION PREP PVP 2OZ (MISCELLANEOUS) ×1 IMPLANT
STOCKINETTE IMPERVIOUS 9X36 MD (GAUZE/BANDAGES/DRESSINGS) ×1 IMPLANT
STRAP SAFETY 5IN WIDE (MISCELLANEOUS) ×1 IMPLANT
STRIP CLOSURE SKIN 1/4X4 (GAUZE/BANDAGES/DRESSINGS) IMPLANT
SUT PROLENE 4 0 PS 2 18 (SUTURE) ×1 IMPLANT
SUT VIC AB 3-0 SH 27X BRD (SUTURE) IMPLANT
TRAP FLUID SMOKE EVACUATOR (MISCELLANEOUS) ×1 IMPLANT
WATER STERILE IRR 500ML POUR (IV SOLUTION) ×1 IMPLANT

## 2023-06-03 NOTE — Op Note (Signed)
06/03/2023  1:31 PM  Patient:   Peter Nguyen  Pre-Op Diagnosis:   Lipoma of left upper extremity.  Post-Op Diagnosis:   Same  Procedure:   Excision of lipoma of left upper extremity  Surgeon:   Maryagnes Amos, MD  Assistant:   None  Anesthesia:   GET  Findings:   As above.  Complications:   None  Fluids:   400 cc crystalloid  EBL:   1 cc  UOP:   None  TT:   19 minutes at 250 mmHg  Drains:   None  Closure:   3-0 Vicryl subcuticular sutures  Brief Clinical Note:   The patient is a 62 year old male with a history of a slowly enlarging mildly uncomfortable soft tissue mass in the antecubital fossa of the left arm.  His symptoms have persisted despite medications, activity modification, etc.  His history and examination are consistent with a lipoma, confirmed by ultrasound.  He presents at this time for excision of the benign appearing soft tissue mass in the antecubital fossa of his left elbow.  Procedure:   The patient was brought into the operating room and laid in the supine position.  After adequate general endotracheal intubation and anesthesia were obtained, the patient's left upper extremity was prepped with ChloraPrep solution before being draped sterilely.  Preoperative antibiotics were administered.  A timeout was performed to verify the appropriate surgical site before the limb was exsanguinated with an Esmarch and the tourniquet inflated to 250 mmHg.  An approximately 4 to 5 cm incision was made transversely, utilizing the medial portion of the prior incision.  This incision was carried down through the dermal tissues to expose the mass.  The mass was dissected out bluntly using Metzenbaum scissors and removed in its entirety.  It was sent to pathology for formal identification.  Hemostasis was achieved using electrocautery.    The wound was irrigated thoroughly with sterile saline solution before being closed using 3-0 Vicryl subcuticular sutures.  Benzoin and  Steri-Strips were applied to the skin.  A total of 10 cc of 0.5% plain Sensorcaine was injected in and around the incision to help with postoperative analgesia.  A sterile bulky dressing was applied to the elbow before the patient was awakened, extubated, and returned to the recovery room in satisfactory condition after tolerating the procedure well.

## 2023-06-03 NOTE — Transfer of Care (Signed)
Immediate Anesthesia Transfer of Care Note  Patient: Peter Nguyen  Procedure(s) Performed: EXCISION OF LIPOMA OF THE ANTEROMEDIAL ASPECT OF LEFT ELBOW (Left: Elbow)  Patient Location: PACU  Anesthesia Type:General  Level of Consciousness: drowsy and patient cooperative  Airway & Oxygen Therapy: Patient Spontanous Breathing and Patient connected to face mask oxygen  Post-op Assessment: Report given to RN and Post -op Vital signs reviewed and stable  Post vital signs: Reviewed and stable  Last Vitals:  Vitals Value Taken Time  BP    Temp    Pulse 98 06/03/23 1334  Resp 20 06/03/23 1334  SpO2 97 % 06/03/23 1334  Vitals shown include unfiled device data.  Last Pain:  Vitals:   06/03/23 1136  TempSrc: Temporal         Complications: No notable events documented.

## 2023-06-03 NOTE — Anesthesia Preprocedure Evaluation (Signed)
Anesthesia Evaluation  Patient identified by MRN, date of birth, ID band Patient awake    Reviewed: Allergy & Precautions, NPO status , Patient's Chart, lab work & pertinent test results  History of Anesthesia Complications (+) PONV and history of anesthetic complications  Airway Mallampati: III  TM Distance: >3 FB Neck ROM: full    Dental no notable dental hx.    Pulmonary neg pulmonary ROS, former smoker   Pulmonary exam normal        Cardiovascular negative cardio ROS Normal cardiovascular exam     Neuro/Psych negative neurological ROS  negative psych ROS   GI/Hepatic negative GI ROS, Neg liver ROS,,,  Endo/Other    Class 3 obesity  Renal/GU      Musculoskeletal  (+) Arthritis ,    Abdominal   Peds  Hematology negative hematology ROS (+)   Anesthesia Other Findings Past Medical History: No date: Arthritis No date: Collar bone fracture 2024: Elevated BP without diagnosis of hypertension No date: Family history of adverse reaction to anesthesia     Comment:  Mom has nausea No date: History of kidney stones No date: Obesity No date: Pneumonia     Comment:  chemical No date: PONV (postoperative nausea and vomiting) No date: Pre-diabetes No date: Seasonal allergies  Past Surgical History: 2003: BICEPS TENDON REPAIR     Comment:  Left 02/11/2019: COLONOSCOPY WITH PROPOFOL; N/A     Comment:  Procedure: COLONOSCOPY WITH PROPOFOL;  Surgeon:               Pasty Spillers, MD;  Location: [facility] ENDOSCOPY;                Service: Gastroenterology;  Laterality: N/A; 1989: KIDNEY SURGERY     Comment:  Cyst removed -Right  2003: KNEE ARTHROSCOPY     Comment:  Right x4 1982: KNEE ARTHROSCOPY     Comment:  Right knee No date: NECK SURGERY     Comment:  C4-6   BMI    Body Mass Index: 40.43 kg/m      Reproductive/Obstetrics negative OB ROS                             Anesthesia  Physical Anesthesia Plan  ASA: 3  Anesthesia Plan: General ETT   Post-op Pain Management: Minimal or no pain anticipated   Induction: Intravenous  PONV Risk Score and Plan: 3 and Ondansetron, Dexamethasone, Midazolam and Treatment may vary due to age or medical condition  Airway Management Planned: Oral ETT  Additional Equipment:   Intra-op Plan:   Post-operative Plan: Extubation in OR  Informed Consent: I have reviewed the patients History and Physical, chart, labs and discussed the procedure including the risks, benefits and alternatives for the proposed anesthesia with the patient or authorized representative who has indicated his/her understanding and acceptance.     Dental Advisory Given  Plan Discussed with: Anesthesiologist, CRNA and Surgeon  Anesthesia Plan Comments: (Patient consented for risks of anesthesia including but not limited to:  - adverse reactions to medications - damage to eyes, teeth, lips or other oral mucosa - nerve damage due to positioning  - sore throat or hoarseness - Damage to heart, brain, nerves, lungs, other parts of body or loss of life  Patient voiced understanding and assent.)       Anesthesia Quick Evaluation

## 2023-06-03 NOTE — H&P (Signed)
History of Present Illness:  Peter Nguyen is a 62 y.o. male who presents for evaluation and treatment of a soft tissue mass in the anteromedial aspect of the left elbow. The patient notes that his symptoms have been present for several years and developed without any specific cause or injury. He did undergo a repair of a distal biceps tendon rupture quite a few years ago but does not believe that this mass developed because of this injury. He notes that the mass "moves" especially with repetitive flexion and rotational activities with his arm and elbow. He finds this to be quite uncomfortable. He saw Dr. Landry Mellow last month who evaluated the mass by ultrasound and noted to be in the subcutaneous tissues and separate from any adjacent structures. The patient was then referred to me for further evaluation and discussion of treatment options.  Current Outpatient Medications:  cetirizine (ZYRTEC) 10 MG chewable tablet Take 10 mg by mouth once daily  multivitamin tablet Take 1 tablet by mouth once daily.   Allergies:  Ciprofloxacin Hives and Itching  Prednisone Other (Sweats, feels bad, and other "weird things")  Opioids/Morphine Analogues (Nausea and Vomiting)   Past Medical History:  Migraine   Past Surgical History:  KNEE ARTHROSCOPY  LEFT BICEP REPAIR  NECK C1/C5   Family History:  Breast cancer Mother  Lymphoma Mother  Lung cancer Mother  Multiple sclerosis Brother  Diabetes Paternal Uncle   Social History:   Socioeconomic History:  Marital status: Married  Tobacco Use  Smoking status: Former  Current packs/day: 0.00  Types: Cigarettes  Quit date: 06/04/1989  Years since quitting: 33.9  Smokeless tobacco: Former  Quit date: 06/05/1995  Vaping Use  Vaping status: Never Used  Substance and Sexual Activity  Alcohol use: Not Currently  Drug use: No  Sexual activity: Defer   Review of Systems:  A comprehensive 14 point ROS was performed, reviewed, and the pertinent  orthopaedic findings are documented in the HPI.  Physical Exam: Vitals:  05/19/23 1057  BP: (!) 144/90  Weight: (!) 149.8 kg (330 lb 3.2 oz)  Height: 180.3 cm (5\' 11" )  PainSc: 0-No pain   General/Constitutional: Pleasant significantly overweight middle-age male in no acute distress. Neuro/Psych: Normal mood and affect, oriented to person, place and time. Eyes: Non-icteric. Pupils are equal, round, and reactive to light, and exhibit synchronous movement. ENT: Unremarkable. Lymphatic: No palpable adenopathy. Respiratory: Lungs clear to auscultation, Normal chest excursion, No wheezes, and Non-labored breathing Cardiovascular: Regular rate and rhythm. No murmurs. and No edema, swelling or tenderness, except as noted in detailed exam. Integumentary: No impressive skin lesions present, except as noted in detailed exam. Musculoskeletal: Unremarkable, except as noted in detailed exam.  Left elbow exam: Skin inspection of the left elbow is notable for a benign appearing soft tissue mass over the anteromedial aspect of the elbow measuring approximately 4 x 2.5 x 1.5 cm dimensions. It appears to be in the subcutaneous tissue and does not appear to be affixed to the adjacent soft tissues. He has no tenderness to palpation. He exhibits full active and passive range of motion of the elbow without any pain or catching. He is grossly neurovascularly intact to the left forearm and hand.  Assessment: Lipoma of left upper extremity.   Plan: The treatment options were discussed with the patient. In addition, patient educational materials were provided regarding the diagnosis and treatment options. The patient is quite frustrated by his symptoms and function limitations, and is ready to consider more aggressive treatment  options. Therefore, I have recommended a surgical procedure, specifically an excision of the presumed lipoma in the anteromedial aspect of his left elbow. The procedure was discussed with  the patient, as were the potential risks (including bleeding, infection, nerve and/or blood vessel injury, persistent or recurrent pain, recurrence of the mass, stiffness of the elbow, need for further surgery, blood clots, strokes, heart attacks and/or arhythmias, pneumonia, etc.) and benefits. The patient states his understanding and wishes to proceed. All of the patient's questions and concerns were answered. He can call any time with further concerns. He will follow up post-surgery, routine.    H&P reviewed and patient re-examined. No changes.

## 2023-06-03 NOTE — Anesthesia Procedure Notes (Signed)
Procedure Name: Intubation Date/Time: 06/03/2023 12:40 PM  Performed by: Mohammed Kindle, CRNAPre-anesthesia Checklist: Patient identified, Emergency Drugs available, Suction available and Patient being monitored Patient Re-evaluated:Patient Re-evaluated prior to induction Oxygen Delivery Method: Circle system utilized Preoxygenation: Pre-oxygenation with 100% oxygen Induction Type: IV induction Ventilation: Mask ventilation without difficulty Laryngoscope Size: McGrath and 3 Grade View: Grade I Tube type: Oral Tube size: 7.0 mm Number of attempts: 1 Airway Equipment and Method: Stylet Placement Confirmation: ETT inserted through vocal cords under direct vision, positive ETCO2 and breath sounds checked- equal and bilateral Secured at: 21 cm Tube secured with: Tape Dental Injury: Teeth and Oropharynx as per pre-operative assessment

## 2023-06-03 NOTE — Anesthesia Postprocedure Evaluation (Signed)
Anesthesia Post Note  Patient: Peter Nguyen  Procedure(s) Performed: EXCISION OF LIPOMA OF THE ANTEROMEDIAL ASPECT OF LEFT ELBOW (Left: Elbow)  Patient location during evaluation: PACU Anesthesia Type: General Level of consciousness: awake and alert Pain management: pain level controlled Vital Signs Assessment: post-procedure vital signs reviewed and stable Respiratory status: spontaneous breathing, nonlabored ventilation, respiratory function stable and patient connected to nasal cannula oxygen Cardiovascular status: blood pressure returned to baseline and stable Postop Assessment: no apparent nausea or vomiting Anesthetic complications: no   No notable events documented.   Last Vitals:  Vitals:   06/03/23 1350 06/03/23 1400  BP:  (!) 143/83  Pulse: 85 83  Resp: 15 (!) 21  Temp:    SpO2: 95% 92%    Last Pain:  Vitals:   06/03/23 1400  TempSrc:   PainSc: 0-No pain                 Louie Boston

## 2023-06-03 NOTE — Discharge Instructions (Signed)
Orthopedic discharge instructions: Keep dressing dry and intact. Keep arm elevated above heart level. May shower after dressing removed on postop day 4 (Saturday). Cover sutures with Band-Aids after drying off. Apply ice to affected area frequently. Take ibuprofen 600-800 mg TID with meals for 3-5 days, then as necessary. May supplement with ES Tylenol if needed.  Return for follow-up in 10-14 days or as scheduled.

## 2023-06-04 ENCOUNTER — Encounter: Payer: Self-pay | Admitting: Surgery

## 2023-06-04 LAB — SURGICAL PATHOLOGY
